# Patient Record
Sex: Female | Born: 1990 | Hispanic: No | Marital: Married | Smoking: Never smoker
Health system: Southern US, Community
[De-identification: ages and names within clinical notes are randomized; demographics above are authoritative.]

## PROBLEM LIST (undated history)

## (undated) DIAGNOSIS — D649 Anemia, unspecified: Secondary | ICD-10-CM

---
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
## 2018-09-13 NOTE — L&D Delivery Note (Signed)
       Delivery Note   Mohogany Toppins is a 28 y.o. G1P1001 at [redacted]w[redacted]d Estimated Date of Delivery: 09/12/19  PRE-OPERATIVE DIAGNOSIS:  1) [redacted]w[redacted]d pregnancy.   POST-OPERATIVE DIAGNOSIS:  1) [redacted]w[redacted]d pregnancy s/p Vaginal, Spontaneous   Delivery Type: Vaginal, Spontaneous    Delivery Anesthesia: Epidural   Labor Complications:  none    ESTIMATED BLOOD LOSS: 415  ml    FINDINGS:   1) female infant, Apgar scores of    at 1 minute and    at 5 minutes and a birthweight of   ounces.    2) Nuchal cord:yes  SPECIMENS:   PLACENTA:   Appearance: Intact , 3 vessel cord   Removal: Spontaneous      Disposition:  held per protocol then discarded  DISPOSITION:  Infant to left in stable condition in the delivery room, with L&D personnel and mother,  NARRATIVE SUMMARY: Labor course:  Ms. Jazminn Pomales is a G1P1001 at [redacted]w[redacted]d who presented for labor management.  She progressed well in labor with pitocin.  She received the appropriate anesthesia and proceeded to complete dilation. She evidenced good maternal expulsive effort during the second stage. She went on to deliver a viable female infant "Adalyn". The placenta delivered without problems and was noted to be complete. A perineal and vaginal examination was performed. Episiotomy/Lacerations: 2nd degree;Vaginal . Episiotomy or lacerations were repaired with 3-0 Vicryl Rapide suture. The patient tolerated this well.  Philip Aspen, CNM  09/12/2019 11:45 PM

## 2018-12-29 ENCOUNTER — Ambulatory Visit: Payer: Self-pay | Admitting: Family Medicine

## 2019-01-17 ENCOUNTER — Telehealth: Payer: Self-pay

## 2019-01-17 NOTE — Telephone Encounter (Signed)
Coronavirus (COVID-19) Are you at risk?  Are you at risk for the Coronavirus (COVID-19)?  To be considered HIGH RISK for Coronavirus (COVID-19), you have to meet the following criteria:  . Traveled to [place], [place], [place], [place] or [place]; or in the [place] to [place], [place], [place], or [place]; and have fever, cough, and shortness of breath within the last 2 weeks of travel OR . Been in close contact with a person diagnosed with COVID-19 within the last 2 weeks and have fever, cough, and shortness of breath . IF YOU DO NOT MEET THESE CRITERIA, YOU ARE CONSIDERED LOW RISK FOR COVID-19.  What to do if you are HIGH RISK for COVID-19?  . If you are having a medical emergency, call 911. . Seek medical care right away. Before you go to a doctor's office, urgent care or emergency department, call ahead and tell them about your recent travel, contact with someone diagnosed with COVID-19, and your symptoms. You should receive instructions from your physician's office regarding next steps of care.  . When you arrive at healthcare provider, tell the healthcare staff immediately you have returned from visiting [place], [place], [place], [place] or [place]; or traveled in the [place] to [place], [place], [place], or [place]; in the last two weeks or you have been in close contact with a person diagnosed with COVID-19 in the last 2 weeks.   . Tell the health care staff about your symptoms: fever, cough and shortness of breath. . After you have been seen by a medical provider, you will be either: o Tested for (COVID-19) and discharged home on quarantine except to seek medical care if symptoms worsen, and asked to  - Stay home and avoid contact with others until you get your results (4-5 days)  - Avoid travel on public transportation if possible (such as bus, train, or airplane) or o Sent to the Emergency Department by EMS for evaluation, COVID-19 testing, and possible  admission depending on your condition and test results.  What to do if you are LOW RISK for COVID-19?  Reduce your risk of any infection by using the same precautions used for avoiding the common cold or flu:  . Wash your hands often with soap and warm water for at least 20 seconds.  If soap and water are not readily available, use an alcohol-based hand sanitizer with at least 60% alcohol.  . If coughing or sneezing, cover your mouth and nose by coughing or sneezing into the elbow areas of your shirt or coat, into a tissue or into your sleeve (not your hands). . Avoid shaking hands with others and consider head nods or verbal greetings only. . Avoid touching your eyes, nose, or mouth with unwashed hands.  . Avoid close contact with people who are sick. . Avoid places or events with large numbers of people in one location, like concerts or sporting events. . Carefully consider travel plans you have or are making. . If you are planning any travel outside or inside the [place], visit the CDC's Travelers' Health webpage for the latest health notices. . If you have some symptoms but not all symptoms, continue to monitor at home and seek medical attention if your symptoms worsen. . If you are having a medical emergency, call 911.   ADDITIONAL HEALTHCARE OPTIONS FOR PATIENTS  [facility] / e-Visit: [email or web address]         [facility]: [phone]  [facility]: [phone]                   [facility]: [phone]   Pre-screen negative, DM.   

## 2019-01-18 ENCOUNTER — Other Ambulatory Visit: Payer: Self-pay

## 2019-01-18 ENCOUNTER — Encounter: Payer: Self-pay | Admitting: Obstetrics and Gynecology

## 2019-01-18 ENCOUNTER — Ambulatory Visit: Payer: BLUE CROSS/BLUE SHIELD | Admitting: Obstetrics and Gynecology

## 2019-01-18 VITALS — BP 136/87 | HR 88 | Ht 63.0 in | Wt 112.5 lb

## 2019-01-18 DIAGNOSIS — N912 Amenorrhea, unspecified: Secondary | ICD-10-CM

## 2019-01-18 LAB — POCT URINE PREGNANCY: Preg Test, Ur: POSITIVE — AB

## 2019-01-18 NOTE — Progress Notes (Signed)
Patient here for confirmation of pregnancy, no complaints.

## 2019-01-18 NOTE — Patient Instructions (Addendum)
Thank you for enrolling in [facility]. Please follow the instructions below to securely access your online medical record. MyChart allows you to send messages to your doctor, view your test results, renew your prescriptions, schedule appointments, and more.  How Do I Sign Up? 1. In your Internet browser, go to [email or web address]. 2. Click on the New  User? link in the Sign In box.  3. Enter your MyChart Access Code exactly as it appears below. You will not need to use this code after you have completed the sign-up process. If you do not sign up before the expiration date, you must request a new code. MyChart Access Code: [email or web address] Expires: 03/04/2019  8:43 AM  4. Enter the last four digits of your Social Security Number (xxxx) and Date of Birth (mm/dd/yyyy) as indicated and click Next. You will be taken to the next sign-up page. 5. Create a MyChart ID. This will be your MyChart login ID and cannot be changed, so think of one that is secure and easy to remember. 6. Create a MyChart password. You can change your password at any time. 7. Enter your Password Reset Question and Answer and click Next. This can be used at a later time if you forget your password.  8. Select your communication preference, and if applicable enter your e-mail address. You will receive e-mail notification when new information is available in MyChart by choosing to receive e-mail notifications and filling in your e-mail. 9. Click Sign In. You can now view your medical record.   Additional Information If you have questions, you can email [email or web address] or call [phone] to talk to our [facility] staff. Remember, MyChart is NOT to be used for urgent needs. For medical emergencies, dial 911.   Common Medications Safe in Pregnancy  Acne:      Constipation:  Benzoyl Peroxide     Colace  Clindamycin      Dulcolax Suppository  Topica Erythromycin     Fibercon  Salicylic Acid       Metamucil         Miralax AVOID:        Senakot   Accutane    Cough:  Retin-A       Cough Drops  Tetracycline      Phenergan w/ Codeine if Rx  Minocycline      Robitussin (Plain & DM)  Antibiotics:     Crabs/Lice:  Ceclor       RID  Cephalosporins    AVOID:  E-Mycins      Kwell  Keflex  Macrobid/Macrodantin   Diarrhea:  Penicillin      Kao-Pectate  Zithromax      Imodium AD         PUSH FLUIDS AVOID:       Cipro     Fever:  Tetracycline      Tylenol (Regular or Extra  Minocycline       Strength)  Levaquin      Extra Strength-Do not          Exceed 8 tabs/24 hrs Caffeine:        <227m/day (equiv. To 1 cup of coffee or  approx. 3 12 oz sodas)         Gas: Cold/Hayfever:       Gas-X  Benadryl      Mylicon  Claritin       Phazyme  **Claritin-D        Chlor-Trimeton    Headaches:  Dimetapp      ASA-Free Excedrin  Drixoral-Non-Drowsy     Cold Compress  Mucinex (Guaifenasin)     Tylenol (Regular or Extra  Sudafed/Sudafed-12 Hour     Strength)  **Sudafed PE Pseudoephedrine   Tylenol Cold & Sinus     Vicks Vapor Rub  Zyrtec  **AVOID if Problems With Blood Pressure         Heartburn: Avoid lying down for at least 1 hour after meals  Aciphex      Maalox     Rash:  Milk of Magnesia     Benadryl    Mylanta       1% Hydrocortisone Cream  Pepcid  Pepcid Complete   Sleep Aids:  Prevacid      Ambien   Prilosec       Benadryl  Rolaids       Chamomile Tea  Tums (Limit 4/day)     Unisom  Zantac       Tylenol PM         Warm milk-add vanilla or  Hemorrhoids:       Sugar for taste  Anusol/Anusol H.C.  (RX: Analapram 2.5%)  Sugar Substitutes:  Hydrocortisone OTC     Ok in moderation  Preparation H      Tucks        Vaseline lotion applied to tissue with wiping    Herpes:     Throat:  Acyclovir      Oragel  Famvir  Valtrex     Vaccines:         Flu Shot Leg Cramps:       *Gardasil  Benadryl      Hepatitis A         Hepatitis B Nasal Spray:       Pneumovax   Saline Nasal Spray     Polio Booster         Tetanus Nausea:       Tuberculosis test or PPD  Vitamin B6 25 mg TID   AVOID:    Dramamine      *Gardasil  Emetrol       Live Poliovirus  Ginger Root 250 mg QID    MMR (measles, mumps &  High Complex Carbs @ Bedtime    rebella)  Sea Bands-Accupressure    Varicella (Chickenpox)  Unisom 1/2 tab TID     *No known complications           If received before Pain:         Known pregnancy;   Darvocet       Resume series after  Lortab        Delivery  Percocet    Yeast:   Tramadol      Femstat  Tylenol 3      Gyne-lotrimin  Ultram       Monistat  Vicodin           MISC:         All Sunscreens           Hair Coloring/highlights          Insect Repellant's          (Including DEET)         Mystic Tans Commonly Asked Questions During Pregnancy  Cats: A parasite can be excreted in cat feces.  To avoid exposure you need to have another person empty the little box.  If you must empty the litter box you will need to  wear gloves.  Wash your hands after handling your cat.  This parasite can also be found in raw or undercooked meat so this should also be avoided.  Colds, Sore Throats, Flu: Please check your medication sheet to see what you can take for symptoms.  If your symptoms are unrelieved by these medications please call the office.  Dental Work: Most any dental work Investment banker, corporate recommends is permitted.  X-rays should only be taken during the first trimester if absolutely necessary.  Your abdomen should be shielded with a lead apron during all x-rays.  Please notify your provider prior to receiving any x-rays.  Novocaine is fine; gas is not recommended.  If your dentist requires a note from [place] prior to dental work please call the office and we will provide one for you.  Exercise: Exercise is an important part of staying healthy during your pregnancy.  You may continue most exercises you were accustomed to prior to pregnancy.  Later in your pregnancy  you will most likely notice you have difficulty with activities requiring balance like riding a bicycle.  It is important that you listen to your body and avoid activities that put you at a higher risk of falling.  Adequate rest and staying well hydrated are a must!  If you have questions about the safety of specific activities ask your provider.    Exposure to Children with illness: Try to avoid obvious exposure; report any symptoms to [place] when noted,  If you have chicken pos, red measles or mumps, you should be immune to these diseases.   Please do not take any vaccines while pregnant unless you have checked with your OB provider.  Fetal Movement: After 28 weeks we recommend you do "kick counts" twice daily.  Lie or sit down in a calm quiet environment and count your baby movements "kicks".  You should feel your baby at least 10 times per hour.  If you have not felt 10 kicks within the first hour get up, walk around and have something sweet to eat or drink then repeat for an additional hour.  If count remains less than 10 per hour notify your provider.  Fumigating: Follow your pest control agent's advice as to how long to stay out of your home.  Ventilate the area well before re-entering.  Hemorrhoids:   Most over-the-counter preparations can be used during pregnancy.  Check your medication to see what is safe to use.  It is important to use a stool softener or fiber in your diet and to drink lots of liquids.  If hemorrhoids seem to be getting worse please call the office.   Hot Tubs:  Hot tubs Jacuzzis and saunas are not recommended while pregnant.  These increase your internal body temperature and should be avoided.  Intercourse:  Sexual intercourse is safe during pregnancy as long as you are comfortable, unless otherwise advised by your provider.  Spotting may occur after intercourse; report any bright red bleeding that is heavier than spotting.  Labor:  If you know that you are in labor, please go  to the hospital.  If you are unsure, please call the office and let us help you decide what to do.  Lifting, straining, etc:  If your job requires heavy lifting or straining please check with your provider for any limitations.  Generally, you should not lift items heavier than that you can lift simply with your hands and arms (no back muscles)  Painting:  Paint fumes do not  harm your pregnancy, but may make you ill and should be avoided if possible.  Latex or water based paints have less odor than oils.  Use adequate ventilation while painting.  Permanents & Hair Color:  Chemicals in hair dyes are not recommended as they cause increase hair dryness which can increase hair loss during pregnancy.  " Highlighting" and permanents are allowed.  Dye may be absorbed differently and permanents may not hold as well during pregnancy.  Sunbathing:  Use a sunscreen, as skin burns easily during pregnancy.  Drink plenty of fluids; avoid over heating.  Tanning Beds:  Because their possible side effects are still unknown, tanning beds are not recommended.  Ultrasound Scans:  Routine ultrasounds are performed at approximately 20 weeks.  You will be able to see your baby's general anatomy an if you would like to know the gender this can usually be determined as well.  If it is questionable when you conceived you may also receive an ultrasound early in your pregnancy for dating purposes.  Otherwise ultrasound exams are not routinely performed unless there is a medical necessity.  Although you can request a scan we ask that you pay for it when conducted because insurance does not cover " patient request" scans.  Work: If your pregnancy proceeds without complications you may work until your due date, unless your physician or employer advises otherwise.  Round Ligament Pain/Pelvic Discomfort:  Sharp, shooting pains not associated with bleeding are fairly common, usually occurring in the second trimester of pregnancy.   They tend to be worse when standing up or when you remain standing for long periods of time.  These are the result of pressure of certain pelvic ligaments called "round ligaments".  Rest, Tylenol and heat seem to be the most effective relief.  As the womb and fetus grow, they rise out of the pelvis and the discomfort improves.  Please notify the office if your pain seems different than that described.  It may represent a more serious condition.  First Trimester of Pregnancy The first trimester of pregnancy is from week 1 until the end of week 13 (months 1 through 3). A week after a sperm fertilizes an egg, the egg will implant on the wall of the uterus. This embryo will begin to develop into a baby. Genes from you and your partner will form the baby. The female genes will determine whether the baby will be a boy or a girl. At 6-8 weeks, the eyes and face will be formed, and the heartbeat can be seen on ultrasound. At the end of 12 weeks, all the baby's organs will be formed. Now that you are pregnant, you will want to do everything you can to have a healthy baby. Two of the most important things are to get good prenatal care and to follow your health care provider's instructions. Prenatal care is all the medical care you receive before the baby's birth. This care will help prevent, find, and treat any problems during the pregnancy and childbirth. Body changes during your first trimester Your body goes through many changes during pregnancy. The changes vary from woman to woman.  You may gain or lose a couple of pounds at first.  You may feel sick to your stomach (nauseous) and you may throw up (vomit). If the vomiting is uncontrollable, call your health care provider.  You may tire easily.  You may develop headaches that can be relieved by medicines. All medicines should be approved by your health  care provider.  You may urinate more often. Painful urination may mean you have a bladder infection.  You  may develop heartburn as a result of your pregnancy.  You may develop constipation because certain hormones are causing the muscles that push stool through your intestines to slow down.  You may develop hemorrhoids or swollen veins (varicose veins).  Your breasts may begin to grow larger and become tender. Your nipples may stick out more, and the tissue that surrounds them (areola) may become darker.  Your gums may bleed and may be sensitive to brushing and flossing.  Dark spots or blotches (chloasma, mask of pregnancy) may develop on your face. This will likely fade after the baby is born.  Your menstrual periods will stop.  You may have a loss of appetite.  You may develop cravings for certain kinds of food.  You may have changes in your emotions from day to day, such as being excited to be pregnant or being concerned that something may go wrong with the pregnancy and baby.  You may have more vivid and strange dreams.  You may have changes in your hair. These can include thickening of your hair, rapid growth, and changes in texture. Some women also have hair loss during or after pregnancy, or hair that feels dry or thin. Your hair will most likely return to normal after your baby is born. What to expect at prenatal visits During a routine prenatal visit:  You will be weighed to make sure you and the baby are growing normally.  Your blood pressure will be taken.  Your abdomen will be measured to track your baby's growth.  The fetal heartbeat will be listened to between weeks 10 and 14 of your pregnancy.  Test results from any previous visits will be discussed. Your health care provider may ask you:  How you are feeling.  If you are feeling the baby move.  If you have had any abnormal symptoms, such as leaking fluid, bleeding, severe headaches, or abdominal cramping.  If you are using any tobacco products, including cigarettes, chewing tobacco, and electronic cigarettes.   If you have any questions. Other tests that may be performed during your first trimester include:  Blood tests to find your blood type and to check for the presence of any previous infections. The tests will also be used to check for low iron levels (anemia) and protein on red blood cells (Rh antibodies). Depending on your risk factors, or if you previously had diabetes during pregnancy, you may have tests to check for high blood sugar that affects pregnant women (gestational diabetes).  Urine tests to check for infections, diabetes, or protein in the urine.  An ultrasound to confirm the proper growth and development of the baby.  Fetal screens for spinal cord problems (spina bifida) and Down syndrome.  HIV (human immunodeficiency virus) testing. Routine prenatal testing includes screening for HIV, unless you choose not to have this test.  You may need other tests to make sure you and the baby are doing well. Follow these instructions at home: Medicines  Follow your health care provider's instructions regarding medicine use. Specific medicines may be either safe or unsafe to take during pregnancy.  Take a prenatal vitamin that contains at least 600 micrograms (mcg) of folic acid.  If you develop constipation, try taking a stool softener if your health care provider approves. Eating and drinking   Eat a balanced diet that includes fresh fruits and vegetables, whole grains, good  sources of protein such as meat, eggs, or tofu, and low-fat dairy. Your health care provider will help you determine the amount of weight gain that is right for you.  Avoid raw meat and uncooked cheese. These carry germs that can cause birth defects in the baby.  Eating four or five small meals rather than three large meals a day may help relieve nausea and vomiting. If you start to feel nauseous, eating a few soda crackers can be helpful. Drinking liquids between meals, instead of during meals, also seems to help  ease nausea and vomiting.  Limit foods that are high in fat and processed sugars, such as fried and sweet foods.  To prevent constipation: ? Eat foods that are high in fiber, such as fresh fruits and vegetables, whole grains, and beans. ? Drink enough fluid to keep your urine clear or pale yellow. Activity  Exercise only as directed by your health care provider. Most women can continue their usual exercise routine during pregnancy. Try to exercise for 30 minutes at least 5 days a week. Exercising will help you: ? Control your weight. ? Stay in shape. ? Be prepared for labor and delivery.  Experiencing pain or cramping in the lower abdomen or lower back is a good sign that you should stop exercising. Check with your health care provider before continuing with normal exercises.  Try to avoid standing for long periods of time. Move your legs often if you must stand in one place for a long time.  Avoid heavy lifting.  Wear low-heeled shoes and practice good posture.  You may continue to have sex unless your health care provider tells you not to. Relieving pain and discomfort  Wear a good support bra to relieve breast tenderness.  Take warm sitz baths to soothe any pain or discomfort caused by hemorrhoids. Use hemorrhoid cream if your health care provider approves.  Rest with your legs elevated if you have leg cramps or low back pain.  If you develop varicose veins in your legs, wear support hose. Elevate your feet for 15 minutes, 3-4 times a day. Limit salt in your diet. Prenatal care  Schedule your prenatal visits by the twelfth week of pregnancy. They are usually scheduled monthly at first, then more often in the last 2 months before delivery.  Write down your questions. Take them to your prenatal visits.  Keep all your prenatal visits as told by your health care provider. This is important. Safety  Wear your seat belt at all times when driving.  Make a list of emergency  phone numbers, including numbers for family, friends, the hospital, and police and fire departments. General instructions  Ask your health care provider for a referral to a local prenatal education class. Begin classes no later than the beginning of month 6 of your pregnancy.  Ask for help if you have counseling or nutritional needs during pregnancy. Your health care provider can offer advice or refer you to specialists for help with various needs.  Do not use hot tubs, steam rooms, or saunas.  Do not douche or use tampons or scented sanitary pads.  Do not cross your legs for long periods of time.  Avoid cat litter boxes and soil used by cats. These carry germs that can cause birth defects in the baby and possibly loss of the fetus by miscarriage or stillbirth.  Avoid all smoking, herbs, alcohol, and medicines not prescribed by your health care provider. Chemicals in these products affect the formation  and growth of the baby.  Do not use any products that contain nicotine or tobacco, such as cigarettes and e-cigarettes. If you need help quitting, ask your health care provider. You may receive counseling support and other resources to help you quit.  Schedule a dentist appointment. At home, brush your teeth with a soft toothbrush and be gentle when you floss. Contact a health care provider if:  You have dizziness.  You have mild pelvic cramps, pelvic pressure, or nagging pain in the abdominal area.  You have persistent nausea, vomiting, or diarrhea.  You have a bad smelling vaginal discharge.  You have pain when you urinate.  You notice increased swelling in your face, hands, legs, or ankles.  You are exposed to fifth disease or chickenpox.  You are exposed to [place] measles (rubella) and have never had it. Get help right away if:  You have a fever.  You are leaking fluid from your vagina.  You have spotting or bleeding from your vagina.  You have severe abdominal cramping  or pain.  You have rapid weight gain or loss.  You vomit blood or material that looks like coffee grounds.  You develop a severe headache.  You have shortness of breath.  You have any kind of trauma, such as from a fall or a car accident. Summary  The first trimester of pregnancy is from week 1 until the end of week 13 (months 1 through 3).  Your body goes through many changes during pregnancy. The changes vary from woman to woman.  You will have routine prenatal visits. During those visits, your health care provider will examine you, discuss any test results you may have, and talk with you about how you are feeling. This information is not intended to replace advice given to you by your health care provider. Make sure you discuss any questions you have with your health care provider. Document Released: 08/24/2001 Document Revised: 08/11/2016 Document Reviewed: 08/11/2016 Elsevier Interactive Patient Education  2019 [employer].

## 2019-01-18 NOTE — Progress Notes (Signed)
  Subjective:     Patient ID: Phyllis Jones, female   DOB: 1990/12/27, 28 y.o.   MRN: [id number]  HPI Here for pregnancy confirmation. States stopped OCPs 1 year ago and is happy about being pregnant. Reports LMP 12/04/2018, with 35-50 day cycles for the last year. EDC by that LMP 09/09/2019 & EGA [redacted]w[redacted]d.  Review of Systems     Objective:   Physical Exam A&Ox4 Well groomed female in no distress Blood pressure 136/87, pulse 88, height 5\' 3"  (1.6 m), weight 112 lb 8 oz (51 kg), last menstrual period 12/04/2018. Body mass index is 19.93 kg/m.  UPT+    Assessment:     Missed menses    Plan:     Congratulated on pregnancy. Information to review given about pregnancy. Will RTC in 2 weeks for viability and dating scan/nurse intake & labs, then 5 weeks for New OB PE with me.      Melody Shambley,CNM

## 2019-01-29 ENCOUNTER — Ambulatory Visit: Payer: Self-pay | Admitting: Family Medicine

## 2019-01-31 ENCOUNTER — Telehealth: Payer: Self-pay

## 2019-01-31 NOTE — Telephone Encounter (Signed)
Coronavirus (COVID-19) Are you at risk?  Are you at risk for the Coronavirus (COVID-19)?  To be considered HIGH RISK for Coronavirus (COVID-19), you have to meet the following criteria:  . Traveled to [place], [place], [place], [place] or [place]; or in the [place] to [place], [place], [place], or [place]; and have fever, cough, and shortness of breath within the last 2 weeks of travel OR . Been in close contact with a person diagnosed with COVID-19 within the last 2 weeks and have fever, cough, and shortness of breath . IF YOU DO NOT MEET THESE CRITERIA, YOU ARE CONSIDERED LOW RISK FOR COVID-19.  What to do if you are HIGH RISK for COVID-19?  . If you are having a medical emergency, call 911. . Seek medical care right away. Before you go to a doctor's office, urgent care or emergency department, call ahead and tell them about your recent travel, contact with someone diagnosed with COVID-19, and your symptoms. You should receive instructions from your physician's office regarding next steps of care.  . When you arrive at healthcare provider, tell the healthcare staff immediately you have returned from visiting [place], [place], [place], [place] or [place]; or traveled in the [place] to [place], [place], [place], or [place]; in the last two weeks or you have been in close contact with a person diagnosed with COVID-19 in the last 2 weeks.   . Tell the health care staff about your symptoms: fever, cough and shortness of breath. . After you have been seen by a medical provider, you will be either: o Tested for (COVID-19) and discharged home on quarantine except to seek medical care if symptoms worsen, and asked to  - Stay home and avoid contact with others until you get your results (4-5 days)  - Avoid travel on public transportation if possible (such as bus, train, or airplane) or o Sent to the Emergency Department by EMS for evaluation, COVID-19 testing, and possible  admission depending on your condition and test results.  What to do if you are LOW RISK for COVID-19?  Reduce your risk of any infection by using the same precautions used for avoiding the common cold or flu:  . Wash your hands often with soap and warm water for at least 20 seconds.  If soap and water are not readily available, use an alcohol-based hand sanitizer with at least 60% alcohol.  . If coughing or sneezing, cover your mouth and nose by coughing or sneezing into the elbow areas of your shirt or coat, into a tissue or into your sleeve (not your hands). . Avoid shaking hands with others and consider head nods or verbal greetings only. . Avoid touching your eyes, nose, or mouth with unwashed hands.  . Avoid close contact with people who are sick. . Avoid places or events with large numbers of people in one location, like concerts or sporting events. . Carefully consider travel plans you have or are making. . If you are planning any travel outside or inside the [place], visit the CDC's Travelers' Health webpage for the latest health notices. . If you have some symptoms but not all symptoms, continue to monitor at home and seek medical attention if your symptoms worsen. . If you are having a medical emergency, call 911.   ADDITIONAL HEALTHCARE OPTIONS FOR PATIENTS  [facility] / e-Visit: [email or web address]         [facility]: [phone]  [facility]: [phone]                   [facility]: [phone]   Pre-screen negative, DM.   

## 2019-02-01 ENCOUNTER — Other Ambulatory Visit: Payer: BLUE CROSS/BLUE SHIELD

## 2019-02-01 ENCOUNTER — Other Ambulatory Visit: Payer: Self-pay

## 2019-02-01 ENCOUNTER — Ambulatory Visit: Payer: BLUE CROSS/BLUE SHIELD

## 2019-02-01 ENCOUNTER — Ambulatory Visit (INDEPENDENT_AMBULATORY_CARE_PROVIDER_SITE_OTHER): Payer: BLUE CROSS/BLUE SHIELD

## 2019-02-01 VITALS — BP 136/97 | HR 74 | Ht 63.0 in | Wt 113.1 lb

## 2019-02-01 DIAGNOSIS — Z3A08 8 weeks gestation of pregnancy: Secondary | ICD-10-CM

## 2019-02-01 DIAGNOSIS — Z3687 Encounter for antenatal screening for uncertain dates: Secondary | ICD-10-CM | POA: Diagnosis not present

## 2019-02-01 DIAGNOSIS — N912 Amenorrhea, unspecified: Secondary | ICD-10-CM

## 2019-02-01 NOTE — Progress Notes (Unsigned)
Phyllis Jones presents for NOB nurse interview visit. Pregnancy confirmation done here at [facility].  G- 1.  P-    . Pregnancy education material explained and given. No cats in the home. NOB labs ordered. , (sickle cell). HIV labs and Drug screen were explained optional and she did not decline. Drug screen ordered PNV encouraged. Genetic screening options discussed. Genetic testing: unsure.  Financial policy discussed, FMLA paper signed, All questions answered, pt will return in 2.5 weeks to see provider.

## 2019-02-03 LAB — HIV ANTIBODY (ROUTINE TESTING W REFLEX): HIV Screen 4th Generation wRfx: NONREACTIVE

## 2019-02-03 LAB — HEPATITIS B SURFACE ANTIGEN: Hepatitis B Surface Ag: NEGATIVE

## 2019-02-03 LAB — HGB SOLU + RFLX FRAC: Sickle Solubility Test - HGBRFX: NEGATIVE

## 2019-02-03 LAB — RPR: RPR Ser Ql: NONREACTIVE

## 2019-02-03 LAB — VARICELLA ZOSTER ANTIBODY, IGG: Varicella zoster IgG: 200 index (ref >165)

## 2019-02-03 LAB — ANTIBODY SCREEN: Antibody Screen: NEGATIVE

## 2019-02-03 LAB — ABO AND RH: Rh Factor: POSITIVE

## 2019-02-22 ENCOUNTER — Other Ambulatory Visit: Payer: Self-pay

## 2019-02-22 ENCOUNTER — Encounter: Payer: Self-pay | Admitting: Obstetrics and Gynecology

## 2019-02-22 ENCOUNTER — Ambulatory Visit (INDEPENDENT_AMBULATORY_CARE_PROVIDER_SITE_OTHER): Payer: BLUE CROSS/BLUE SHIELD | Admitting: Obstetrics and Gynecology

## 2019-02-22 VITALS — BP 122/70 | HR 88 | Wt 115.9 lb

## 2019-02-22 DIAGNOSIS — Z3401 Encounter for supervision of normal first pregnancy, first trimester: Secondary | ICD-10-CM

## 2019-02-22 DIAGNOSIS — Z3493 Encounter for supervision of normal pregnancy, unspecified, third trimester: Secondary | ICD-10-CM

## 2019-02-22 DIAGNOSIS — Z3A11 11 weeks gestation of pregnancy: Secondary | ICD-10-CM

## 2019-02-22 LAB — POCT URINALYSIS DIPSTICK OB
Bilirubin, UA: NEGATIVE
Blood, UA: NEGATIVE
Glucose, UA: NEGATIVE
Ketones, UA: NEGATIVE
Leukocytes, UA: NEGATIVE
Nitrite, UA: NEGATIVE
POC,PROTEIN,UA: NEGATIVE
Spec Grav, UA: 1.015 (ref 1.010–1.025)
Urobilinogen, UA: 0.2 E.U./dL
pH, UA: 7 (ref 5.0–8.0)

## 2019-02-22 NOTE — Progress Notes (Signed)
NEW OB HISTORY AND PHYSICAL  SUBJECTIVE:       Phyllis Jones is a 28 y.o. G1P0 female, Patient's last menstrual period was 12/06/2018., Estimated Date of Delivery: 09/12/19, 4123w1d, presents today for establishment of Prenatal Care. She has no unusual complaints and complains of some nausea.      Gynecologic History Patient's last menstrual period was 12/06/2018. Normal Contraception: none Last Pap: 2019. Results were: normal  Obstetric History OB History  Gravida Para Term Preterm AB Living  1            SAB TAB Ectopic Multiple Live Births               # Outcome Date GA Lbr Len/2nd Weight Sex Delivery Anes PTL Lv  1 Current             History reviewed. No pertinent past medical history.  Past Surgical History:  Procedure Laterality Date  . TONSILLECTOMY  2004    Current Outpatient Medications on File Prior to Visit  Medication Sig Dispense Refill  . Prenatal Vit-Fe Fumarate-FA (MULTIVITAMIN-PRENATAL) 27-0.8 MG TABS tablet Take 1 tablet by mouth daily at 12 noon.     No current facility-administered medications on file prior to visit.     No Known Allergies  Social History   Socioeconomic History  . Marital status: Married    Spouse name: Not on file  . Number of children: Not on file  . Years of education: Not on file  . Highest education level: Not on file  Occupational History  . Not on file  Social Needs  . Financial resource strain: Not on file  . Food insecurity    Worry: Not on file    Inability: Not on file  . Transportation needs    Medical: Not on file    Non-medical: Not on file  Tobacco Use  . Smoking status: Never Smoker  . Smokeless tobacco: Never Used  Substance and Sexual Activity  . Alcohol use: Not Currently  . Drug use: Never  . Sexual activity: Yes    Birth control/protection: None  Lifestyle  . Physical activity    Days per week: Not on file    Minutes per session: Not on file  . Stress: Not on file  Relationships  .  Social Musicianconnections    Talks on phone: Not on file    Gets together: Not on file    Attends religious service: Not on file    Active member of club or organization: Not on file    Attends meetings of clubs or organizations: Not on file    Relationship status: Not on file  . Intimate partner violence    Fear of current or ex partner: Not on file    Emotionally abused: Not on file    Physically abused: Not on file    Forced sexual activity: Not on file  Other Topics Concern  . Not on file  Social History Narrative  . Not on file    Family History  Problem Relation Age of Onset  . Breast cancer Neg Hx   . Ovarian cancer Neg Hx   . Colon cancer Neg Hx     The following portions of the patient's history were reviewed and updated as appropriate: allergies, current medications, past OB history, past medical history, past surgical history, past family history, past social history, and problem list.    OBJECTIVE: Initial Physical Exam (New OB)  GENERAL APPEARANCE: alert, well appearing, in  no apparent distress, oriented to person, place and time HEAD: normocephalic, atraumatic MOUTH: mucous membranes moist, pharynx normal without lesions and dental hygiene good THYROID: no thyromegaly or masses present BREASTS: not examined LUNGS: clear to auscultation, no wheezes, rales or rhonchi, symmetric air entry HEART: regular rate and rhythm, no murmurs ABDOMEN: soft, nontender, nondistended, no abnormal masses, no epigastric pain, fundus not palpable and FHT present EXTREMITIES: no redness or tenderness in the calves or thighs SKIN: normal coloration and turgor, no rashes LYMPH NODES: no adenopathy palpable NEUROLOGIC: alert, oriented, normal speech, no focal findings or movement disorder noted  PELVIC EXAM deferred  ASSESSMENT: Normal pregnancy Elevated BP  PLAN: Prenatal care Declines genetic screening Will check BP at home and work- states normal readings outside of doctors  office, 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  See orders

## 2019-02-22 NOTE — Patient Instructions (Signed)

## 2019-02-22 NOTE — Progress Notes (Signed)
NOB PE- pt is doing well, declines genetic testing

## 2019-02-23 LAB — BASIC METABOLIC PANEL
BUN/Creatinine Ratio: 13 (ref 9–23)
BUN: 8 mg/dL (ref 6–20)
CO2: 21 mmol/L (ref 20–29)
Calcium: 9 mg/dL (ref 8.7–10.2)
Chloride: 99 mmol/L (ref 96–106)
Creatinine, Ser: 0.6 mg/dL (ref 0.57–1.00)
GFR calc Af Amer: 147 mL/min/{1.73_m2} (ref >59)
GFR calc non Af Amer: 127 mL/min/{1.73_m2} (ref >59)
Glucose: 78 mg/dL (ref 65–99)
Potassium: 3.9 mmol/L (ref 3.5–5.2)
Sodium: 134 mmol/L (ref 134–144)

## 2019-02-23 LAB — CBC
Hematocrit: 36.6 % (ref 34.0–46.6)
Hemoglobin: 12.7 g/dL (ref 11.1–15.9)
MCH: 30 pg (ref 26.6–33.0)
MCHC: 34.7 g/dL (ref 31.5–35.7)
MCV: 86 fL (ref 79–97)
Platelets: 313 10*3/uL (ref 150–450)
RBC: 4.24 x10E6/uL (ref 3.77–5.28)
RDW: 13.3 % (ref 11.7–15.4)
WBC: 7.5 10*3/uL (ref 3.4–10.8)

## 2019-02-23 LAB — RUBELLA SCREEN: Rubella Antibodies, IGG: 9.95 index (ref >0.99)

## 2019-03-22 ENCOUNTER — Telehealth: Payer: Self-pay

## 2019-03-22 NOTE — Telephone Encounter (Signed)
Pt called no answer LMTRC for prescreening. 

## 2019-03-23 ENCOUNTER — Other Ambulatory Visit: Payer: Self-pay

## 2019-03-23 ENCOUNTER — Other Ambulatory Visit: Payer: Self-pay | Admitting: Certified Nurse Midwife

## 2019-03-23 ENCOUNTER — Ambulatory Visit (INDEPENDENT_AMBULATORY_CARE_PROVIDER_SITE_OTHER): Payer: BC Managed Care – PPO | Admitting: Certified Nurse Midwife

## 2019-03-23 VITALS — BP 114/76 | HR 91 | Wt 122.1 lb

## 2019-03-23 DIAGNOSIS — Z3689 Encounter for other specified antenatal screening: Secondary | ICD-10-CM

## 2019-03-23 DIAGNOSIS — Z3402 Encounter for supervision of normal first pregnancy, second trimester: Secondary | ICD-10-CM

## 2019-03-23 LAB — POCT URINALYSIS DIPSTICK OB
Bilirubin, UA: NEGATIVE
Blood, UA: NEGATIVE
Glucose, UA: NEGATIVE
Ketones, UA: NEGATIVE
Leukocytes, UA: NEGATIVE
Nitrite, UA: NEGATIVE
POC,PROTEIN,UA: NEGATIVE
Spec Grav, UA: 1.015 (ref 1.010–1.025)
Urobilinogen, UA: 0.2 E.U./dL
pH, UA: 7.5 (ref 5.0–8.0)

## 2019-03-23 NOTE — Progress Notes (Signed)
ROB-Feeling good overall. Questions answered regarding sleeping positions and food precautions during pregnancy. Discussed upcoming course of prenatal care. Reviewed red flag symptoms and when to call. RTC x 4-5 weeks for ANATOMY SCAN and ROB or sooner if needed.

## 2019-03-23 NOTE — Patient Instructions (Signed)
Common Medications Safe in Pregnancy  Acne:      Constipation:  Benzoyl Peroxide     Colace  Clindamycin      Dulcolax Suppository  Topica Erythromycin     Fibercon  Salicylic Acid      Metamucil         Miralax AVOID:        Senakot   Accutane    Cough:  Retin-A       Cough Drops  Tetracycline      Phenergan w/ Codeine if Rx  Minocycline      Robitussin (Plain & DM)  Antibiotics:     Crabs/Lice:  Ceclor       RID  Cephalosporins    AVOID:  E-Mycins      Kwell  Keflex  Macrobid/Macrodantin   Diarrhea:  Penicillin      Kao-Pectate  Zithromax      Imodium AD         PUSH FLUIDS AVOID:       Cipro     Fever:  Tetracycline      Tylenol (Regular or Extra  Minocycline       Strength)  Levaquin      Extra Strength-Do not          Exceed 8 tabs/24 hrs Caffeine:        <264m/day (equiv. To 1 cup of coffee or  approx. 3 12 oz sodas)         Gas: Cold/Hayfever:       Gas-X  Benadryl      Mylicon  Claritin       Phazyme  **Claritin-D        Chlor-Trimeton    Headaches:  Dimetapp      ASA-Free Excedrin  Drixoral-Non-Drowsy     Cold Compress  Mucinex (Guaifenasin)     Tylenol (Regular or Extra  Sudafed/Sudafed-12 Hour     Strength)  **Sudafed PE Pseudoephedrine   Tylenol Cold & Sinus     Vicks Vapor Rub  Zyrtec  **AVOID if Problems With Blood Pressure         Heartburn: Avoid lying down for at least 1 hour after meals  Aciphex      Maalox     Rash:  Milk of Magnesia     Benadryl    Mylanta       1% Hydrocortisone Cream  Pepcid  Pepcid Complete   Sleep Aids:  Prevacid      Ambien   Prilosec       Benadryl  Rolaids       Chamomile Tea  Tums (Limit 4/day)     Unisom  Zantac       Tylenol PM         Warm milk-add vanilla or  Hemorrhoids:       Sugar for taste  Anusol/Anusol H.C.  (RX: Analapram 2.5%)  Sugar Substitutes:  Hydrocortisone OTC     Ok in moderation  Preparation H      Tucks        Vaseline lotion applied to tissue with wiping    Herpes:      Throat:  Acyclovir      Oragel  Famvir  Valtrex     Vaccines:         Flu Shot Leg Cramps:       *Gardasil  Benadryl      Hepatitis A         Hepatitis B Nasal Spray:  Pneumovax  Saline Nasal Spray     Polio Booster         Tetanus Nausea:       Tuberculosis test or PPD  Vitamin B6 25 mg TID   AVOID:    Dramamine      *Gardasil  Emetrol       Live Poliovirus  Ginger Root 250 mg QID    MMR (measles, mumps &  High Complex Carbs @ Bedtime    rebella)  Sea Bands-Accupressure    Varicella (Chickenpox)  Unisom 1/2 tab TID     *No known complications           If received before Pain:         Known pregnancy;   Darvocet       Resume series after  Lortab        Delivery  Percocet    Yeast:   Tramadol      Femstat  Tylenol 3      Gyne-lotrimin  Ultram       Monistat  Vicodin           MISC:         All Sunscreens           Hair Coloring/highlights          Insect Repellant's          (Including DEET)         Mystic Tans Second Trimester of Pregnancy  The second trimester is from week 14 through week 27 (month 4 through 6). This is often the time in pregnancy that you feel your best. Often times, morning sickness has lessened or quit. You may have more energy, and you may get hungry more often. Your unborn baby is growing rapidly. At the end of the sixth month, he or she is about 9 inches long and weighs about 1 pounds. You will likely feel the baby move between 18 and 20 weeks of pregnancy. Follow these instructions at home: Medicines  Take over-the-counter and prescription medicines only as told by your doctor. Some medicines are safe and some medicines are not safe during pregnancy.  Take a prenatal vitamin that contains at least 600 micrograms (mcg) of folic acid.  If you have trouble pooping (constipation), take medicine that will make your stool soft (stool softener) if your doctor approves. Eating and drinking   Eat regular, healthy meals.  Avoid raw meat and  uncooked cheese.  If you get low calcium from the food you eat, talk to your doctor about taking a daily calcium supplement.  Avoid foods that are high in fat and sugars, such as fried and sweet foods.  If you feel sick to your stomach (nauseous) or throw up (vomit): ? Eat 4 or 5 small meals a day instead of 3 large meals. ? Try eating a few soda crackers. ? Drink liquids between meals instead of during meals.  To prevent constipation: ? Eat foods that are high in fiber, like fresh fruits and vegetables, whole grains, and beans. ? Drink enough fluids to keep your pee (urine) clear or pale yellow. Activity  Exercise only as told by your doctor. Stop exercising if you start to have cramps.  Do not exercise if it is too hot, too humid, or if you are in a place of great height (high altitude).  Avoid heavy lifting.  Wear low-heeled shoes. Sit and stand up straight.  You can continue to have sex unless your doctor   tells you not to. Relieving pain and discomfort  Wear a good support bra if your breasts are tender.  Take warm water baths (sitz baths) to soothe pain or discomfort caused by hemorrhoids. Use hemorrhoid cream if your doctor approves.  Rest with your legs raised if you have leg cramps or low back pain.  If you develop puffy, bulging veins (varicose veins) in your legs: ? Wear support hose or compression stockings as told by your doctor. ? Raise (elevate) your feet for 15 minutes, 3-4 times a day. ? Limit salt in your food. Prenatal care  Write down your questions. Take them to your prenatal visits.  Keep all your prenatal visits as told by your doctor. This is important. Safety  Wear your seat belt when driving.  Make a list of emergency phone numbers, including numbers for family, friends, the hospital, and police and fire departments. General instructions  Ask your doctor about the right foods to eat or for help finding a counselor, if you need these services.   Ask your doctor about local prenatal classes. Begin classes before month 6 of your pregnancy.  Do not use hot tubs, steam rooms, or saunas.  Do not douche or use tampons or scented sanitary pads.  Do not cross your legs for long periods of time.  Visit your dentist if you have not done so. Use a soft toothbrush to brush your teeth. Floss gently.  Avoid all smoking, herbs, and alcohol. Avoid drugs that are not approved by your doctor.  Do not use any products that contain nicotine or tobacco, such as cigarettes and e-cigarettes. If you need help quitting, ask your doctor.  Avoid cat litter boxes and soil used by cats. These carry germs that can cause birth defects in the baby and can cause a loss of your baby (miscarriage) or stillbirth. Contact a doctor if:  You have mild cramps or pressure in your lower belly.  You have pain when you pee (urinate).  You have bad smelling fluid coming from your vagina.  You continue to feel sick to your stomach (nauseous), throw up (vomit), or have watery poop (diarrhea).  You have a nagging pain in your belly area.  You feel dizzy. Get help right away if:  You have a fever.  You are leaking fluid from your vagina.  You have spotting or bleeding from your vagina.  You have severe belly cramping or pain.  You lose or gain weight rapidly.  You have trouble catching your breath and have chest pain.  You notice sudden or extreme puffiness (swelling) of your face, hands, ankles, feet, or legs.  You have not felt the baby move in over an hour.  You have severe headaches that do not go away when you take medicine.  You have trouble seeing. Summary  The second trimester is from week 14 through week 27 (months 4 through 6). This is often the time in pregnancy that you feel your best.  To take care of yourself and your unborn baby, you will need to eat healthy meals, take medicines only if your doctor tells you to do so, and do  activities that are safe for you and your baby.  Call your doctor if you get sick or if you notice anything unusual about your pregnancy. Also, call your doctor if you need help with the right food to eat, or if you want to know what activities are safe for you. This information is not intended to replace  advice given to you by your health care provider. Make sure you discuss any questions you have with your health care provider. Document Released: 11/24/2009 Document Revised: 12/22/2018 Document Reviewed: 10/05/2016 Elsevier Patient Education  2020 [employer]vier Inc.  

## 2019-03-28 ENCOUNTER — Telehealth: Payer: Self-pay | Admitting: Obstetrics and Gynecology

## 2019-03-28 NOTE — Telephone Encounter (Signed)
The patient called and stated that she needs a call back from her nurse this morning. No other information was disclosed. Please advise.

## 2019-03-28 NOTE — Telephone Encounter (Signed)
Sent pt mcm-ac 

## 2019-04-18 ENCOUNTER — Encounter: Payer: BC Managed Care – PPO | Admitting: Certified Nurse Midwife

## 2019-04-18 ENCOUNTER — Other Ambulatory Visit: Payer: BC Managed Care – PPO

## 2019-04-25 ENCOUNTER — Encounter: Payer: Self-pay | Admitting: Certified Nurse Midwife

## 2019-04-25 ENCOUNTER — Ambulatory Visit (INDEPENDENT_AMBULATORY_CARE_PROVIDER_SITE_OTHER): Payer: BC Managed Care – PPO | Admitting: Certified Nurse Midwife

## 2019-04-25 ENCOUNTER — Other Ambulatory Visit: Payer: Self-pay

## 2019-04-25 ENCOUNTER — Ambulatory Visit (INDEPENDENT_AMBULATORY_CARE_PROVIDER_SITE_OTHER): Payer: BC Managed Care – PPO

## 2019-04-25 VITALS — BP 111/86 | HR 88 | Wt 124.6 lb

## 2019-04-25 DIAGNOSIS — Z3402 Encounter for supervision of normal first pregnancy, second trimester: Secondary | ICD-10-CM | POA: Diagnosis not present

## 2019-04-25 DIAGNOSIS — Z3689 Encounter for other specified antenatal screening: Secondary | ICD-10-CM | POA: Diagnosis not present

## 2019-04-25 DIAGNOSIS — Z3A20 20 weeks gestation of pregnancy: Secondary | ICD-10-CM

## 2019-04-25 LAB — POCT URINALYSIS DIPSTICK OB
Bilirubin, UA: NEGATIVE
Blood, UA: NEGATIVE
Glucose, UA: NEGATIVE
Ketones, UA: NEGATIVE
Leukocytes, UA: NEGATIVE
Nitrite, UA: NEGATIVE
POC,PROTEIN,UA: NEGATIVE
Spec Grav, UA: 1.02 (ref 1.010–1.025)
Urobilinogen, UA: 0.2 E.U./dL
pH, UA: 6 (ref 5.0–8.0)

## 2019-04-25 NOTE — Progress Notes (Signed)
ROB doing well. Feels little movement. Anatomy u/s today ( see below). Discussed anterior placenta. She verbalizes understanding. Follow up 4 wks u/s and ROB with Melody .   Philip Aspen, CNM   Patient Name: Phyllis Jones DOB: 09/23/1990 MRN: [id number] ULTRASOUND REPORT  Location: [facility] Date of Service: 04/25/2019   Indications:Anatomy Ultrasound Findings:  [name] intrauterine pregnancy is visualized with FHR at 163 BPM. Biometrics give an (U/S) Gestational age of 107w4d and an (U/S) EDD of 09/15/2019; this correlates with the clinically established Estimated Date of Delivery: 09/12/19  Fetal presentation is Variable.  EFW: 326 g ( 12 oz). Placenta: anterior. Grade: 1 AFI: subjectively normal.  Anatomic survey is incomplete for Spine and normal; Gender - female.    Right Ovary is normal in appearance. Left Ovary is normal appearance. Survey of the adnexa demonstrates no adnexal masses. There is no free peritoneal fluid in the cul de sac.  Impression: 1. [redacted]w[redacted]d Viable Singleton Intrauterine pregnancy by U/S. 2. (U/S) EDD is consistent with Clinically established Estimated Date of Delivery: 09/12/19 . 3. Normal Anatomy Scan 4  Anatomic survey is incomplete for Spine   Recommendations: 1.Clinical correlation with the patient's History and Physical Exam.   Jenine M. Albertine Grates    RDMS

## 2019-04-25 NOTE — Patient Instructions (Signed)

## 2019-05-24 ENCOUNTER — Encounter: Payer: BC Managed Care – PPO | Admitting: Obstetrics and Gynecology

## 2019-05-24 ENCOUNTER — Other Ambulatory Visit: Payer: BC Managed Care – PPO

## 2019-05-25 ENCOUNTER — Ambulatory Visit (INDEPENDENT_AMBULATORY_CARE_PROVIDER_SITE_OTHER): Payer: BLUE CROSS/BLUE SHIELD | Admitting: Certified Nurse Midwife

## 2019-05-25 ENCOUNTER — Encounter: Payer: Self-pay | Admitting: Certified Nurse Midwife

## 2019-05-25 ENCOUNTER — Other Ambulatory Visit: Payer: Self-pay

## 2019-05-25 VITALS — BP 113/71 | HR 84 | Wt 128.3 lb

## 2019-05-25 DIAGNOSIS — Z3402 Encounter for supervision of normal first pregnancy, second trimester: Secondary | ICD-10-CM

## 2019-05-25 LAB — POCT URINALYSIS DIPSTICK OB
Bilirubin, UA: NEGATIVE
Blood, UA: NEGATIVE
Glucose, UA: NEGATIVE
Ketones, UA: NEGATIVE
Leukocytes, UA: NEGATIVE
Nitrite, UA: NEGATIVE
POC,PROTEIN,UA: NEGATIVE
Spec Grav, UA: 1.02 (ref 1.010–1.025)
Urobilinogen, UA: 0.2 E.U./dL
pH, UA: 7 (ref 5.0–8.0)

## 2019-05-25 NOTE — Addendum Note (Signed)
Addended by: Raliegh Ip on: 05/25/2019 06:09 PM   Modules accepted: Orders

## 2019-05-25 NOTE — Progress Notes (Signed)
ROB doing well. Follow up u/s was postponed due tech being out. She is going to reschedule today. Discussed 28 wk labs she verbalizes and agrees to plan. Declines flu until next visit. Follow for u/s and ROB in 4 wks.   Philip Aspen, CNM

## 2019-05-25 NOTE — Patient Instructions (Signed)

## 2019-06-19 ENCOUNTER — Other Ambulatory Visit: Payer: Self-pay

## 2019-06-19 ENCOUNTER — Ambulatory Visit (INDEPENDENT_AMBULATORY_CARE_PROVIDER_SITE_OTHER): Payer: BLUE CROSS/BLUE SHIELD | Admitting: Obstetrics and Gynecology

## 2019-06-19 ENCOUNTER — Ambulatory Visit (INDEPENDENT_AMBULATORY_CARE_PROVIDER_SITE_OTHER): Payer: BLUE CROSS/BLUE SHIELD

## 2019-06-19 ENCOUNTER — Other Ambulatory Visit: Payer: BLUE CROSS/BLUE SHIELD

## 2019-06-19 VITALS — BP 113/80 | HR 78 | Wt 132.7 lb

## 2019-06-19 DIAGNOSIS — Z3402 Encounter for supervision of normal first pregnancy, second trimester: Secondary | ICD-10-CM

## 2019-06-19 DIAGNOSIS — Z3492 Encounter for supervision of normal pregnancy, unspecified, second trimester: Secondary | ICD-10-CM

## 2019-06-19 DIAGNOSIS — Z3493 Encounter for supervision of normal pregnancy, unspecified, third trimester: Secondary | ICD-10-CM

## 2019-06-19 NOTE — Patient Instructions (Signed)
Third Trimester of Pregnancy The third trimester is from week 28 through week 40 (months 7 through 9). The third trimester is a time when the unborn baby (fetus) is growing rapidly. At the end of the ninth month, the fetus is about 20 inches in length and weighs 6-10 pounds. Body changes during your third trimester Your body will continue to go through many changes during pregnancy. The changes vary from woman to woman. During the third trimester:  Your weight will continue to increase. You can expect to gain 25-35 pounds (11-16 kg) by the end of the pregnancy.  You may begin to get stretch marks on your hips, abdomen, and breasts.  You may urinate more often because the fetus is moving lower into your pelvis and pressing on your bladder.  You may develop or continue to have heartburn. This is caused by increased hormones that slow down muscles in the digestive tract.  You may develop or continue to have constipation because increased hormones slow digestion and cause the muscles that push waste through your intestines to relax.  You may develop hemorrhoids. These are swollen veins (varicose veins) in the rectum that can itch or be painful.  You may develop swollen, bulging veins (varicose veins) in your legs.  You may have increased body aches in the pelvis, back, or thighs. This is due to weight gain and increased hormones that are relaxing your joints.  You may have changes in your hair. These can include thickening of your hair, rapid growth, and changes in texture. Some women also have hair loss during or after pregnancy, or hair that feels dry or thin. Your hair will most likely return to normal after your baby is born.  Your breasts will continue to grow and they will continue to become tender. A yellow fluid (colostrum) may leak from your breasts. This is the first milk you are producing for your baby.  Your belly button may stick out.  You may notice more swelling in your hands,  face, or ankles.  You may have increased tingling or numbness in your hands, arms, and legs. The skin on your belly may also feel numb.  You may feel short of breath because of your expanding uterus.  You may have more problems sleeping. This can be caused by the size of your belly, increased need to urinate, and an increase in your body's metabolism.  You may notice the fetus "dropping," or moving lower in your abdomen (lightening).  You may have increased vaginal discharge.  You may notice your joints feel loose and you may have pain around your pelvic bone. What to expect at prenatal visits You will have prenatal exams every 2 weeks until week 36. Then you will have weekly prenatal exams. During a routine prenatal visit:  You will be weighed to make sure you and the baby are growing normally.  Your blood pressure will be taken.  Your abdomen will be measured to track your baby's growth.  The fetal heartbeat will be listened to.  Any test results from the previous visit will be discussed.  You may have a cervical check near your due date to see if your cervix has softened or thinned (effaced).  You will be tested for Group B streptococcus. This happens between 35 and 37 weeks. Your health care provider may ask you:  What your birth plan is.  How you are feeling.  If you are feeling the baby move.  If you have had any abnormal   symptoms, such as leaking fluid, bleeding, severe headaches, or abdominal cramping.  If you are using any tobacco products, including cigarettes, chewing tobacco, and electronic cigarettes.  If you have any questions. Other tests or screenings that may be performed during your third trimester include:  Blood tests that check for low iron levels (anemia).  Fetal testing to check the health, activity level, and growth of the fetus. Testing is done if you have certain medical conditions or if there are problems during the pregnancy.  Nonstress test  (NST). This test checks the health of your baby to make sure there are no signs of problems, such as the baby not getting enough oxygen. During this test, a belt is placed around your belly. The baby is made to move, and its heart rate is monitored during movement. What is false labor? False labor is a condition in which you feel small, irregular tightenings of the muscles in the womb (contractions) that usually go away with rest, changing position, or drinking water. These are called Braxton Hicks contractions. Contractions may last for hours, days, or even weeks before true labor sets in. If contractions come at regular intervals, become more frequent, increase in intensity, or become painful, you should see your health care provider. What are the signs of labor?  Abdominal cramps.  Regular contractions that start at 10 minutes apart and become stronger and more frequent with time.  Contractions that start on the top of the uterus and spread down to the lower abdomen and back.  Increased pelvic pressure and dull back pain.  A watery or bloody mucus discharge that comes from the vagina.  Leaking of amniotic fluid. This is also known as your "water breaking." It could be a slow trickle or a gush. Let your health care provider know if it has a color or strange odor. If you have any of these signs, call your health care provider right away, even if it is before your due date. Follow these instructions at home: Medicines  Follow your health care provider's instructions regarding medicine use. Specific medicines may be either safe or unsafe to take during pregnancy.  Take a prenatal vitamin that contains at least 600 micrograms (mcg) of folic acid.  If you develop constipation, try taking a stool softener if your health care provider approves. Eating and drinking   Eat a balanced diet that includes fresh fruits and vegetables, whole grains, good sources of protein such as meat, eggs, or tofu,  and low-fat dairy. Your health care provider will help you determine the amount of weight gain that is right for you.  Avoid raw meat and uncooked cheese. These carry germs that can cause birth defects in the baby.  If you have low calcium intake from food, talk to your health care provider about whether you should take a daily calcium supplement.  Eat four or five small meals rather than three large meals a day.  Limit foods that are high in fat and processed sugars, such as fried and sweet foods.  To prevent constipation: ? Drink enough fluid to keep your urine clear or pale yellow. ? Eat foods that are high in fiber, such as fresh fruits and vegetables, whole grains, and beans. Activity  Exercise only as directed by your health care provider. Most women can continue their usual exercise routine during pregnancy. Try to exercise for 30 minutes at least 5 days a week. Stop exercising if you experience uterine contractions.  Avoid heavy lifting.  Do   not exercise in extreme heat or humidity, or at high altitudes.  Wear low-heel, comfortable shoes.  Practice good posture.  You may continue to have sex unless your health care provider tells you otherwise. Relieving pain and discomfort  Take frequent breaks and rest with your legs elevated if you have leg cramps or low back pain.  Take warm sitz baths to soothe any pain or discomfort caused by hemorrhoids. Use hemorrhoid cream if your health care provider approves.  Wear a good support bra to prevent discomfort from breast tenderness.  If you develop varicose veins: ? Wear support pantyhose or compression stockings as told by your healthcare provider. ? Elevate your feet for 15 minutes, 3-4 times a day. Prenatal care  Write down your questions. Take them to your prenatal visits.  Keep all your prenatal visits as told by your health care provider. This is important. Safety  Wear your seat belt at all times when driving.  Make  a list of emergency phone numbers, including numbers for family, friends, the hospital, and police and fire departments. General instructions  Avoid cat litter boxes and soil used by cats. These carry germs that can cause birth defects in the baby. If you have a cat, ask someone to clean the litter box for you.  Do not travel far distances unless it is absolutely necessary and only with the approval of your health care provider.  Do not use hot tubs, steam rooms, or saunas.  Do not drink alcohol.  Do not use any products that contain nicotine or tobacco, such as cigarettes and e-cigarettes. If you need help quitting, ask your health care provider.  Do not use any medicinal herbs or unprescribed drugs. These chemicals affect the formation and growth of the baby.  Do not douche or use tampons or scented sanitary pads.  Do not cross your legs for long periods of time.  To prepare for the arrival of your baby: ? Take prenatal classes to understand, practice, and ask questions about labor and delivery. ? Make a trial run to the hospital. ? Visit the hospital and tour the maternity area. ? Arrange for maternity or paternity leave through employers. ? Arrange for family and friends to take care of pets while you are in the hospital. ? Purchase a rear-facing car seat and make sure you know how to install it in your car. ? Pack your hospital bag. ? Prepare the baby's nursery. Make sure to remove all pillows and stuffed animals from the baby's crib to prevent suffocation.  Visit your dentist if you have not gone during your pregnancy. Use a soft toothbrush to brush your teeth and be gentle when you floss. Contact a health care provider if:  You are unsure if you are in labor or if your water has broken.  You become dizzy.  You have mild pelvic cramps, pelvic pressure, or nagging pain in your abdominal area.  You have lower back pain.  You have persistent nausea, vomiting, or diarrhea.   You have an unusual or bad smelling vaginal discharge.  You have pain when you urinate. Get help right away if:  Your water breaks before 37 weeks.  You have regular contractions less than 5 minutes apart before 37 weeks.  You have a fever.  You are leaking fluid from your vagina.  You have spotting or bleeding from your vagina.  You have severe abdominal pain or cramping.  You have rapid weight loss or weight gain.  You have   shortness of breath with chest pain.  You notice sudden or extreme swelling of your face, hands, ankles, feet, or legs.  Your baby makes fewer than 10 movements in 2 hours.  You have severe headaches that do not go away when you take medicine.  You have vision changes. Summary  The third trimester is from week 28 through week 40, months 7 through 9. The third trimester is a time when the unborn baby (fetus) is growing rapidly.  During the third trimester, your discomfort may increase as you and your baby continue to gain weight. You may have abdominal, leg, and back pain, sleeping problems, and an increased need to urinate.  During the third trimester your breasts will keep growing and they will continue to become tender. A yellow fluid (colostrum) may leak from your breasts. This is the first milk you are producing for your baby.  False labor is a condition in which you feel small, irregular tightenings of the muscles in the womb (contractions) that eventually go away. These are called Braxton Hicks contractions. Contractions may last for hours, days, or even weeks before true labor sets in.  Signs of labor can include: abdominal cramps; regular contractions that start at 10 minutes apart and become stronger and more frequent with time; watery or bloody mucus discharge that comes from the vagina; increased pelvic pressure and dull back pain; and leaking of amniotic fluid. This information is not intended to replace advice given to you by your health  care provider. Make sure you discuss any questions you have with your health care provider. Document Released: 08/24/2001 Document Revised: 12/21/2018 Document Reviewed: 10/05/2016 [employer] Patient Education  2020 [employer]  

## 2019-06-19 NOTE — Progress Notes (Signed)
ROB, glucola and completing anatomy scan this visit, doing well without concerns. Reviewed anatomy scan below: desires waiting on Tdap till next visit. Encouraged to enroll in classes.   Indications: Anatomy follow up ultrasound Findings:  Singleton intrauterine pregnancy is visualized with FHR at 150 BPM.  Fetal presentation is Transverse.  Placenta: anterior. Grade: 1 AFI: subjectively normal.  Anatomic survey is complete.   There is no free peritoneal fluid in the cul de sac.  Impression: 1. [redacted]w[redacted]d Viable Singleton Intrauterine pregnancy previously established criteria. 2. Normal Anatomy Scan is now complete

## 2019-06-19 NOTE — Progress Notes (Signed)
ROB- glucola done, blood consent signed, pt is doing well 

## 2019-06-20 ENCOUNTER — Other Ambulatory Visit: Payer: Self-pay | Admitting: Certified Nurse Midwife

## 2019-06-20 DIAGNOSIS — O99810 Abnormal glucose complicating pregnancy: Secondary | ICD-10-CM

## 2019-06-20 LAB — URINALYSIS, ROUTINE W REFLEX MICROSCOPIC
Bilirubin, UA: NEGATIVE
Glucose, UA: NEGATIVE
Ketones, UA: NEGATIVE
Leukocytes,UA: NEGATIVE
Nitrite, UA: NEGATIVE
Protein,UA: NEGATIVE
RBC, UA: NEGATIVE
Specific Gravity, UA: 1.018 (ref 1.005–1.030)
Urobilinogen, Ur: 0.2 mg/dL (ref 0.2–1.0)
pH, UA: 7 (ref 5.0–7.5)

## 2019-06-20 LAB — MONITOR DRUG PROFILE 14(MW)
Amphetamine Scrn, Ur: NEGATIVE ng/mL
BARBITURATE SCREEN URINE: NEGATIVE ng/mL
BENZODIAZEPINE SCREEN, URINE: NEGATIVE ng/mL
Buprenorphine, Urine: NEGATIVE ng/mL
CANNABINOIDS UR QL SCN: NEGATIVE ng/mL
Cocaine (Metab) Scrn, Ur: NEGATIVE ng/mL
Creatinine(Crt), U: 123.7 mg/dL (ref 20.0–300.0)
Fentanyl, Urine: NEGATIVE pg/mL
Meperidine Screen, Urine: NEGATIVE ng/mL
Methadone Screen, Urine: NEGATIVE ng/mL
OXYCODONE+OXYMORPHONE UR QL SCN: NEGATIVE ng/mL
Opiate Scrn, Ur: NEGATIVE ng/mL
Ph of Urine: 7 (ref 4.5–8.9)
Phencyclidine Qn, Ur: NEGATIVE ng/mL
Propoxyphene Scrn, Ur: NEGATIVE ng/mL
SPECIFIC GRAVITY: 1.016
Tramadol Screen, Urine: NEGATIVE ng/mL

## 2019-06-20 LAB — CBC
Hematocrit: 29.6 % — ABNORMAL LOW (ref 34.0–46.6)
Hemoglobin: 10.2 g/dL — ABNORMAL LOW (ref 11.1–15.9)
MCH: 28.6 pg (ref 26.6–33.0)
MCHC: 34.5 g/dL (ref 31.5–35.7)
MCV: 83 fL (ref 79–97)
Platelets: 228 10*3/uL (ref 150–450)
RBC: 3.57 x10E6/uL — ABNORMAL LOW (ref 3.77–5.28)
RDW: 11.9 % (ref 11.7–15.4)
WBC: 6.6 10*3/uL (ref 3.4–10.8)

## 2019-06-20 LAB — GLUCOSE TOLERANCE, 1 HOUR: Glucose, 1Hr PP: 142 mg/dL (ref 65–199)

## 2019-06-20 LAB — RPR: RPR Ser Ql: NONREACTIVE

## 2019-06-20 NOTE — Progress Notes (Signed)
1 hr glucose elevated, 3 hr test ordered. Pt notified.   Philip Aspen, CNM

## 2019-06-21 LAB — URINE CULTURE: Organism ID, Bacteria: NO GROWTH

## 2019-06-22 ENCOUNTER — Encounter: Payer: Self-pay | Admitting: Certified Nurse Midwife

## 2019-06-22 DIAGNOSIS — O99019 Anemia complicating pregnancy, unspecified trimester: Secondary | ICD-10-CM | POA: Insufficient documentation

## 2019-06-22 DIAGNOSIS — R7309 Other abnormal glucose: Secondary | ICD-10-CM | POA: Insufficient documentation

## 2019-06-22 LAB — GC/CHLAMYDIA PROBE AMP
Chlamydia trachomatis, NAA: NEGATIVE
Neisseria Gonorrhoeae by PCR: NEGATIVE

## 2019-07-02 ENCOUNTER — Other Ambulatory Visit: Payer: BLUE CROSS/BLUE SHIELD

## 2019-07-02 ENCOUNTER — Other Ambulatory Visit: Payer: Self-pay

## 2019-07-02 ENCOUNTER — Ambulatory Visit (INDEPENDENT_AMBULATORY_CARE_PROVIDER_SITE_OTHER): Payer: BLUE CROSS/BLUE SHIELD | Admitting: Certified Nurse Midwife

## 2019-07-02 VITALS — BP 114/63 | HR 78 | Wt 133.0 lb

## 2019-07-02 DIAGNOSIS — Z23 Encounter for immunization: Secondary | ICD-10-CM

## 2019-07-02 DIAGNOSIS — Z0289 Encounter for other administrative examinations: Secondary | ICD-10-CM

## 2019-07-02 DIAGNOSIS — O99810 Abnormal glucose complicating pregnancy: Secondary | ICD-10-CM

## 2019-07-02 DIAGNOSIS — Z3493 Encounter for supervision of normal pregnancy, unspecified, third trimester: Secondary | ICD-10-CM

## 2019-07-02 LAB — POCT URINALYSIS DIPSTICK OB
Bilirubin, UA: NEGATIVE
Blood, UA: NEGATIVE
Glucose, UA: NEGATIVE
Ketones, UA: NEGATIVE
Leukocytes, UA: NEGATIVE
Nitrite, UA: NEGATIVE
POC,PROTEIN,UA: NEGATIVE
Spec Grav, UA: 1.015 (ref 1.010–1.025)
Urobilinogen, UA: 0.2 E.U./dL
pH, UA: 6.5 (ref 5.0–8.0)

## 2019-07-02 MED ORDER — TETANUS-DIPHTH-ACELL PERTUSSIS 5-2.5-18.5 LF-MCG/0.5 IM SUSP
0.5000 mL | Freq: Once | INTRAMUSCULAR | Status: AC
  Administered 2019-07-02: 0.5 mL via INTRAMUSCULAR

## 2019-07-02 NOTE — Patient Instructions (Signed)
Contraception Choices °Contraception, also called birth control, means things to use or ways to try not to get pregnant. °Hormonal birth control °This kind of birth control uses hormones. Here are some types of hormonal birth control: °· A tube that is put under skin of the arm (implant). The tube can stay in for as long as 3 years. °· Shots to get every 3 months (injections). °· Pills to take every day (birth control pills). °· A patch to change 1 time each week for 3 weeks (birth control patch). After that, the patch is taken off for 1 week. °· A ring to put in the vagina. The ring is left in for 3 weeks. Then it is taken out of the vagina for 1 week. Then a new ring is put in. °· Pills to take after unprotected sex (emergency birth control pills). °Barrier birth control °Here are some types of barrier birth control: °· A thin covering that is put on the penis before sex (female condom). The covering is thrown away after sex. °· A soft, loose covering that is put in the vagina before sex (female condom). The covering is thrown away after sex. °· A rubber bowl that sits over the cervix (diaphragm). The bowl must be made for you. The bowl is put into the vagina before sex. The bowl is left in for 6-8 hours after sex. It is taken out within 24 hours. °· A small, soft cup that fits over the cervix (cervical cap). The cup must be made for you. The cup can be left in for 6-8 hours after sex. It is taken out within 48 hours. °· A sponge that is put into the vagina before sex. It must be left in for at least 6 hours after sex. It must be taken out within 30 hours. Then it is thrown away. °· A chemical that kills or stops sperm from getting into the uterus (spermicide). It may be a pill, cream, jelly, or foam to put in the vagina. The chemical should be used at least 10-15 minutes before sex. °IUD (intrauterine) birth control °An IUD is a small, T-shaped piece of plastic. It is put inside the uterus. There are two  kinds: °· Hormone IUD. This kind can stay in for 3-5 years. °· Copper IUD. This kind can stay in for 10 years. °Permanent birth control °Here are some types of permanent birth control: °· Surgery to block the fallopian tubes. °· Having an insert put into each fallopian tube. °· Surgery to tie off the tubes that carry sperm (vasectomy). °Natural planning birth control °Here are some types of natural planning birth control: °· Not having sex on the days the woman could get pregnant. °· Using a calendar: °? To keep track of the length of each period. °? To find out what days pregnancy can happen. °? To plan to not have sex on days when pregnancy can happen. °· Watching for symptoms of ovulation and not having sex during ovulation. One way the woman can check for ovulation is to check her temperature. °· Waiting to have sex until after ovulation. °Summary °· Contraception, also called birth control, means things to use or ways to try not to get pregnant. °· Hormonal methods of birth control include implants, injections, pills, patches, vaginal rings, and emergency birth control pills. °· Barrier methods of birth control can include female condoms, female condoms, diaphragms, cervical caps, sponges, and spermicides. °· There are two types of IUD (intrauterine device) birth control.   An IUD can be put in a woman's uterus to prevent pregnancy for 3-5 years.  Permanent sterilization can be done through a procedure for males, females, or both.  Natural planning methods involve not having sex on the days when the woman could get pregnant. This information is not intended to replace advice given to you by your health care provider. Make sure you discuss any questions you have with your health care provider. Document Released: 06/27/2009 Document Revised: 12/20/2018 Document Reviewed: 09/09/2016 Elsevier Patient Education  2020 [employer]   WHAT OB PATIENTS CAN EXPECT   Confirmation of pregnancy and ultrasound  ordered if medically indicated-[redacted] weeks gestation  New OB (NOB) intake with nurse and New OB (NOB) labs- [redacted] weeks gestation  New OB (NOB) physical examination with provider- 11/[redacted] weeks gestation  Flu vaccine-[redacted] weeks gestation  Anatomy scan-[redacted] weeks gestation  Glucose tolerance test, blood work to test for anemia, T-dap vaccine-[redacted] weeks gestation  Vaginal swabs/cultures-STD/Group B strep-[redacted] weeks gestation  Appointments every 4 weeks until 28 weeks  Every 2 weeks from 28 weeks until 36 weeks  Weekly visits from 36 weeks until delivery    Round Ligament Pain  The round ligament is a cord of muscle and tissue that helps support the uterus. It can become a source of pain during pregnancy if it becomes stretched or twisted as the baby grows. The pain usually begins in the second trimester (13-28 weeks) of pregnancy, and it can come and go until the baby is delivered. It is not a serious problem, and it does not cause harm to the baby. Round ligament pain is usually a short, sharp, and pinching pain, but it can also be a dull, lingering, and aching pain. The pain is felt in the lower side of the abdomen or in the groin. It usually starts deep in the groin and moves up to the outside of the hip area. The pain may occur when you:  Suddenly change position, such as quickly going from a sitting to standing position.  Roll over in bed.  Cough or sneeze.  Do physical activity. Follow these instructions at home:   Watch your condition for any changes.  When the pain starts, relax. Then try any of these methods to help with the pain: ? Sitting down. ? Flexing your knees up to your abdomen. ? Lying on your side with one pillow under your abdomen and another pillow between your legs. ? Sitting in a warm bath for 15-20 minutes or until the pain goes away.  Take over-the-counter and prescription medicines only as told by your health care provider.  Move slowly when you sit down or stand  up.  Avoid long walks if they cause pain.  Stop or reduce your physical activities if they cause pain.  Keep all follow-up visits as told by your health care provider. This is important. Contact a health care provider if:  Your pain does not go away with treatment.  You feel pain in your back that you did not have before.  Your medicine is not helping. Get help right away if:  You have a fever or chills.  You develop uterine contractions.  You have vaginal bleeding.  You have nausea or vomiting.  You have diarrhea.  You have pain when you urinate. Summary  Round ligament pain is felt in the lower abdomen or groin. It is usually a short, sharp, and pinching pain. It can also be a dull, lingering, and aching pain.  This  pain usually begins in the second trimester (13-28 weeks). It occurs because the uterus is stretching with the growing baby, and it is not harmful to the baby.  You may notice the pain when you suddenly change position, when you cough or sneeze, or during physical activity.  Relaxing, flexing your knees to your abdomen, lying on one side, or taking a warm bath may help to get rid of the pain.  Get help from your health care provider if the pain does not go away or if you have vaginal bleeding, nausea, vomiting, diarrhea, or painful urination. This information is not intended to replace advice given to you by your health care provider. Make sure you discuss any questions you have with your health care provider. Document Released: 06/08/2008 Document Revised: 02/15/2018 Document Reviewed: 02/15/2018 Elsevier Patient Education  2020 [employer]   Back Pain in Pregnancy Back pain during pregnancy is common. Back pain may be caused by several factors that are related to changes during your pregnancy. Follow these instructions at home: Managing pain, stiffness, and swelling      If directed, for sudden (acute) back pain, put ice on the painful  area. ? Put ice in a plastic bag. ? Place a towel between your skin and the bag. ? Leave the ice on for 20 minutes, 2-3 times per day.  If directed, apply heat to the affected area before you exercise. Use the heat source that your health care provider recommends, such as a moist heat pack or a heating pad. ? Place a towel between your skin and the heat source. ? Leave the heat on for 20-30 minutes. ? Remove the heat if your skin turns bright red. This is especially important if you are unable to feel pain, heat, or cold. You may have a greater risk of getting burned.  If directed, massage the affected area. Activity  Exercise as told by your health care provider. Gentle exercise is the best way to prevent or manage back pain.  Listen to your body when lifting. If lifting hurts, ask for help or bend your knees. This uses your leg muscles instead of your back muscles.  Squat down when picking up something from the floor. Do not bend over.  Only use bed rest for short periods as told by your health care provider. Bed rest should only be used for the most severe episodes of back pain. Standing, sitting, and lying down  Do not stand in one place for long periods of time.  Use good posture when sitting. Make sure your head rests over your shoulders and is not hanging forward. Use a pillow on your lower back if necessary.  Try sleeping on your side, preferably the left side, with a pregnancy support pillow or 1-2 regular pillows between your legs. ? If you have back pain after a night's rest, your bed may be too soft. ? A firm mattress may provide more support for your back during pregnancy. General instructions  Do not wear high heels.  Eat a healthy diet. Try to gain weight within your health care provider's recommendations.  Use a maternity girdle, elastic sling, or back brace as told by your health care provider.  Take over-the-counter and prescription medicines only as told by  your health care provider.  Work with a physical therapist or massage therapist to find ways to manage back pain. Acupuncture or massage therapy may be helpful.  Keep all follow-up visits as told by your health care provider.  This is important. Contact a health care provider if:  Your back pain interferes with your daily activities.  You have increasing pain in other parts of your body. Get help right away if:  You develop numbness, tingling, weakness, or problems with the use of your arms or legs.  You develop severe back pain that is not controlled with medicine.  You have a change in bowel or bladder control.  You develop shortness of breath, dizziness, or you faint.  You develop nausea, vomiting, or sweating.  You have back pain that is a rhythmic, cramping pain similar to labor pains. Labor pain is usually 1-2 minutes apart, lasts for about 1 minute, and involves a bearing down feeling or pressure in your pelvis.  You have back pain and your water breaks or you have vaginal bleeding.  You have back pain or numbness that travels down your leg.  Your back pain developed after you fell.  You develop pain on one side of your back.  You see blood in your urine.  You develop skin blisters in the area of your back pain. Summary  Back pain may be caused by several factors that are related to changes during your pregnancy.  Follow instructions as told by your health care provider for managing pain, stiffness, and swelling.  Exercise as told by your health care provider. Gentle exercise is the best way to prevent or manage back pain.  Take over-the-counter and prescription medicines only as told by your health care provider.  Keep all follow-up visits as told by your health care provider. This is important. This information is not intended to replace advice given to you by your health care provider. Make sure you discuss any questions you have with your health care  provider. Document Released: 12/08/2005 Document Revised: 12/19/2018 Document Reviewed: 02/15/2018 Elsevier Patient Education  2020 [employer] Inc.   Pain Relief During Labor and Delivery Many things can cause pain during labor and delivery, including:  Pressure on bones and ligaments due to the baby moving through the pelvis.  Stretching of tissues due to the baby moving through the birth canal.  Muscle tension due to anxiety or nervousness.  The uterus tightening (contracting) and relaxing to help move the baby. There are many ways to deal with the pain of labor and delivery. They include:  Taking prenatal classes. Taking these classes helps you know what to expect during your babys birth. What you learn will increase your confidence and decrease your anxiety.  Practicing relaxation techniques or doing relaxing activities, such as: ? Focused breathing. ? Meditation. ? Visualization. ? Aroma therapy. ? Listening to your favorite music. ? Hypnosis.  Taking a warm shower or bath (hydrotherapy). This may: ? Provide comfort and relaxation. ? Lessen your perception of pain. ? Decrease the amount of pain medicine needed. ? Decrease the length of labor.  Getting a massage or counterpressure on your back.  Applying warm packs or ice packs.  Changing positions often, moving around, or using a birthing ball.  Getting: ? Pain medicine through an IV or injection into a muscle. ? Pain medicine inserted into your spinal column. ? Injections of sterile water just under the skin on your lower back (intradermal injections). ? Laughing gas (nitrous oxide). Discuss your pain control options with your health care provider during your prenatal visits. Explore the options offered by your hospital or birth center. What kinds of medicine are available? There are two kinds of medicines that can be used to  relieve pain during labor and delivery:  Analgesics. These medicines decrease pain  without causing you to lose feeling or the ability to move your muscles.  Anesthetics. These medicines block feeling in the body and can decrease your ability to move freely. Both of these kinds of medicine can cause minor side effects, such as nausea, trouble concentrating, and sleepiness. They can also decrease the baby's heart rate before birth and affect the babys breathing rate after birth. For this reason, health care providers are careful about when and how much medicine is given. What are specific medicines and procedures that provide pain relief? Local Anesthetics Local anesthetics are used to numb a small area of the body. They may be used along with another kind of anesthetic or used to numb the nerves of the vagina, cervix, and perineum during the second stage of labor. General Anesthetics General anesthetics cause you to lose consciousness so you do not feel pain. They are usually only used for an emergency cesarean delivery. General anesthetics are given through an IV tube and a mask. Pudendal Block A pudendal block is a form of local anesthetic. It may be used to relieve the pain associated with pushing or stretching of the perineum at the time of delivery or to further numb the perineum. A pudendal block is done by injecting numbing medicine through the vaginal wall into a nerve in the pelvis. Epidural Analgesia Epidural analgesia is given through a flexible IV catheter that is inserted into the lower back. Numbing medicine is delivered continuously to the area near your spinal column nerves (epidural space). After having this type of analgesia, you may be able to move your legs but you most likely will not be able to walk. Depending on the amount of medicine given, you may lose all feeling in the lower half of your body, or you may retain some level of sensation, including the urge to push. Epidural analgesia can be used to provide pain relief for a vaginal birth. Spinal Block A  spinal block is similar to epidural analgesia, but the medicine is injected into the spinal fluid instead of the epidural space. A spinal block is only given once. It starts to relieve pain quickly, but the pain relief lasts only 1-6 hours. Spinal blocks can be used for cesarean deliveries. Combined Spinal-Epidural (CSE) Block A CSE block combines the effects of a spinal block and epidural analgesia. The spinal block works quickly to block all pain. The epidural analgesia provides continuous pain relief, even after the effects of the spinal block have worn off. This information is not intended to replace advice given to you by your health care provider. Make sure you discuss any questions you have with your health care provider. Document Released: 12/16/2008 Document Revised: 08/12/2017 Document Reviewed: 01/21/2016 Elsevier Patient Education  2020 [employer].   [facility]  [address] [place], [place], [place] [address]  Phone: [phone]   [facility] (second location)  [address] [place], [place] [address]  Phone: [phone]   [facility] [address] [place], [place] [address] Phone: [phone]   [facility]  [address] [place], [place] [address]  Phone: [phone]  Breastfeeding  Choosing to breastfeed is one of the best decisions you can make for yourself and your baby. A change in hormones during pregnancy causes your breasts to make breast milk in your milk-producing glands. Hormones prevent breast milk from being released  before your baby is born. They also prompt milk flow after birth. Once breastfeeding has begun, thoughts of your baby, as well as his or her sucking or crying, can stimulate the release of milk from your milk-producing glands. Benefits of breastfeeding Research shows that breastfeeding offers many health benefits for infants and mothers. It also offers  a cost-free and convenient way to feed your baby. For your baby  Your first milk (colostrum) helps your baby's digestive system to function better.  Special cells in your milk (antibodies) help your baby to fight off infections.  Breastfed babies are less likely to develop asthma, allergies, obesity, or type 2 diabetes. They are also at lower risk for sudden infant death syndrome (SIDS).  Nutrients in breast milk are better able to meet your babys needs compared to infant formula.  Breast milk improves your baby's brain development. For you  Breastfeeding helps to create a very special bond between you and your baby.  Breastfeeding is convenient. Breast milk costs nothing and is always available at the correct temperature.  Breastfeeding helps to burn calories. It helps you to lose the weight that you gained during pregnancy.  Breastfeeding makes your uterus return faster to its size before pregnancy. It also slows bleeding (lochia) after you give birth.  Breastfeeding helps to lower your risk of developing type 2 diabetes, osteoporosis, rheumatoid arthritis, cardiovascular disease, and breast, ovarian, uterine, and endometrial cancer later in life. Breastfeeding basics Starting breastfeeding  Find a comfortable place to sit or lie down, with your neck and back well-supported.  Place a pillow or a rolled-up blanket under your baby to bring him or her to the level of your breast (if you are seated). Nursing pillows are specially designed to help support your arms and your baby while you breastfeed.  Make sure that your baby's tummy (abdomen) is facing your abdomen.  Gently massage your breast. With your fingertips, massage from the outer edges of your breast inward toward the nipple. This encourages milk flow. If your milk flows slowly, you may need to continue this action during the feeding.  Support your breast with 4 fingers underneath and your thumb above your nipple (make the  letter "C" with your hand). Make sure your fingers are well away from your nipple and your babys mouth.  Stroke your baby's lips gently with your finger or nipple.  When your baby's mouth is open wide enough, quickly bring your baby to your breast, placing your entire nipple and as much of the areola as possible into your baby's mouth. The areola is the colored area around your nipple. ? More areola should be visible above your baby's upper lip than below the lower lip. ? Your baby's lips should be opened and extended outward (flanged) to ensure an adequate, comfortable latch. ? Your baby's tongue should be between his or her lower gum and your breast.  Make sure that your baby's mouth is correctly positioned around your nipple (latched). Your baby's lips should create a seal on your breast and be turned out (everted).  It is common for your baby to suck about 2-3 minutes in order to start the flow of breast milk. Latching Teaching your baby how to latch onto your breast properly is very important. An improper latch can cause nipple pain, decreased milk supply, and poor weight gain in your baby. Also, if your baby is not latched onto your nipple properly, he or she may swallow some air during feeding. This can make  your baby fussy. Burping your baby when you switch breasts during the feeding can help to get rid of the air. However, teaching your baby to latch on properly is still the best way to prevent fussiness from swallowing air while breastfeeding. Signs that your baby has successfully latched onto your nipple  Silent tugging or silent sucking, without causing you pain. Infant's lips should be extended outward (flanged).  Swallowing heard between every 3-4 sucks once your milk has started to flow (after your let-down milk reflex occurs).  Muscle movement above and in front of his or her ears while sucking. Signs that your baby has not successfully latched onto your nipple  Sucking sounds  or smacking sounds from your baby while breastfeeding.  Nipple pain. If you think your baby has not latched on correctly, slip your finger into the corner of your babys mouth to break the suction and place it between your baby's gums. Attempt to start breastfeeding again. Signs of successful breastfeeding Signs from your baby  Your baby will gradually decrease the number of sucks or will completely stop sucking.  Your baby will fall asleep.  Your baby's body will relax.  Your baby will retain a small amount of milk in his or her mouth.  Your baby will let go of your breast by himself or herself. Signs from you  Breasts that have increased in firmness, weight, and size 1-3 hours after feeding.  Breasts that are softer immediately after breastfeeding.  Increased milk volume, as well as a change in milk consistency and color by the fifth day of breastfeeding.  Nipples that are not sore, cracked, or bleeding. Signs that your baby is getting enough milk  Wetting at least 1-2 diapers during the first 24 hours after birth.  Wetting at least 5-6 diapers every 24 hours for the first week after birth. The urine should be clear or pale yellow by the age of 5 days.  Wetting 6-8 diapers every 24 hours as your baby continues to grow and develop.  At least 3 stools in a 24-hour period by the age of 5 days. The stool should be soft and yellow.  At least 3 stools in a 24-hour period by the age of 7 days. The stool should be seedy and yellow.  No loss of weight greater than 10% of birth weight during the first 3 days of life.  Average weight gain of 4-7 oz (113-198 g) per week after the age of 4 days.  Consistent daily weight gain by the age of 5 days, without weight loss after the age of 2 weeks. After a feeding, your baby may spit up a small amount of milk. This is normal. Breastfeeding frequency and duration Frequent feeding will help you make more milk and can prevent sore nipples and  extremely full breasts (breast engorgement). Breastfeed when you feel the need to reduce the fullness of your breasts or when your baby shows signs of hunger. This is called "breastfeeding on demand." Signs that your baby is hungry include:  Increased alertness, activity, or restlessness.  Movement of the head from side to side.  Opening of the mouth when the corner of the mouth or cheek is stroked (rooting).  Increased sucking sounds, smacking lips, cooing, sighing, or squeaking.  Hand-to-mouth movements and sucking on fingers or hands.  Fussing or crying. Avoid introducing a pacifier to your baby in the first 4-6 weeks after your baby is born. After this time, you may choose to use a  pacifier. Research has shown that pacifier use during the first year of a baby's life decreases the risk of sudden infant death syndrome (SIDS). Allow your baby to feed on each breast as long as he or she wants. When your baby unlatches or falls asleep while feeding from the first breast, offer the second breast. Because newborns are often sleepy in the first few weeks of life, you may need to awaken your baby to get him or her to feed. Breastfeeding times will vary from baby to baby. However, the following rules can serve as a guide to help you make sure that your baby is properly fed:  Newborns (babies 69 weeks of age or younger) may breastfeed every 1-3 hours.  Newborns should not go without breastfeeding for longer than 3 hours during the day or 5 hours during the night.  You should breastfeed your baby a minimum of 8 times in a 24-hour period. Breast milk pumping     Pumping and storing breast milk allows you to make sure that your baby is exclusively fed your breast milk, even at times when you are unable to breastfeed. This is especially important if you go back to work while you are still breastfeeding, or if you are not able to be present during feedings. Your lactation consultant can help you find a  method of pumping that works best for you and give you guidelines about how long it is safe to store breast milk. Caring for your breasts while you breastfeed Nipples can become dry, cracked, and sore while breastfeeding. The following recommendations can help keep your breasts moisturized and healthy:  Avoid using soap on your nipples.  Wear a supportive bra designed especially for nursing. Avoid wearing underwire-style bras or extremely tight bras (sports bras).  Air-dry your nipples for 3-4 minutes after each feeding.  Use only cotton bra pads to absorb leaked breast milk. Leaking of breast milk between feedings is normal.  Use lanolin on your nipples after breastfeeding. Lanolin helps to maintain your skin's normal moisture barrier. Pure lanolin is not harmful (not toxic) to your baby. You may also hand express a few drops of breast milk and gently massage that milk into your nipples and allow the milk to air-dry. In the first few weeks after giving birth, some women experience breast engorgement. Engorgement can make your breasts feel heavy, warm, and tender to the touch. Engorgement peaks within 3-5 days after you give birth. The following recommendations can help to ease engorgement:  Completely empty your breasts while breastfeeding or pumping. You may want to start by applying warm, moist heat (in the shower or with warm, water-soaked hand towels) just before feeding or pumping. This increases circulation and helps the milk flow. If your baby does not completely empty your breasts while breastfeeding, pump any extra milk after he or she is finished.  Apply ice packs to your breasts immediately after breastfeeding or pumping, unless this is too uncomfortable for you. To do this: ? Put ice in a plastic bag. ? Place a towel between your skin and the bag. ? Leave the ice on for 20 minutes, 2-3 times a day.  Make sure that your baby is latched on and positioned properly while  breastfeeding. If engorgement persists after 48 hours of following these recommendations, contact your health care provider or a Science writer. Overall health care recommendations while breastfeeding  Eat 3 healthy meals and 3 snacks every day. Well-nourished mothers who are breastfeeding need an additional  450-500 calories a day. You can meet this requirement by increasing the amount of a balanced diet that you eat.  Drink enough water to keep your urine pale yellow or clear.  Rest often, relax, and continue to take your prenatal vitamins to prevent fatigue, stress, and low vitamin and mineral levels in your body (nutrient deficiencies).  Do not use any products that contain nicotine or tobacco, such as cigarettes and e-cigarettes. Your baby may be harmed by chemicals from cigarettes that pass into breast milk and exposure to secondhand smoke. If you need help quitting, ask your health care provider.  Avoid alcohol.  Do not use illegal drugs or marijuana.  Talk with your health care provider before taking any medicines. These include over-the-counter and prescription medicines as well as vitamins and herbal supplements. Some medicines that may be harmful to your baby can pass through breast milk.  It is possible to become pregnant while breastfeeding. If birth control is desired, ask your health care provider about options that will be safe while breastfeeding your baby. Where to find more information: [employer]: www.llli.org Contact a health care provider if:  You feel like you want to stop breastfeeding or have become frustrated with breastfeeding.  Your nipples are cracked or bleeding.  Your breasts are red, tender, or warm.  You have: ? Painful breasts or nipples. ? A swollen area on either breast. ? A fever or chills. ? Nausea or vomiting. ? Drainage other than breast milk from your nipples.  Your breasts do not become full before feedings by the  fifth day after you give birth.  You feel sad and depressed.  Your baby is: ? Too sleepy to eat well. ? Having trouble sleeping. ? More than 64 week old and wetting fewer than 6 diapers in a 24-hour period. ? Not gaining weight by 45 days of age.  Your baby has fewer than 3 stools in a 24-hour period.  Your baby's skin or the white parts of his or her eyes become yellow. Get help right away if:  Your baby is overly tired (lethargic) and does not want to wake up and feed.  Your baby develops an unexplained fever. Summary  Breastfeeding offers many health benefits for infant and mothers.  Try to breastfeed your infant when he or she shows early signs of hunger.  Gently tickle or stroke your baby's lips with your finger or nipple to allow the baby to open his or her mouth. Bring the baby to your breast. Make sure that much of the areola is in your baby's mouth. Offer one side and burp the baby before you offer the other side.  Talk with your health care provider or lactation consultant if you have questions or you face problems as you breastfeed. This information is not intended to replace advice given to you by your health care provider. Make sure you discuss any questions you have with your health care provider. Document Released: 08/30/2005 Document Revised: 11/24/2017 Document Reviewed: 10/01/2016 Elsevier Patient Education  2020 [employer].

## 2019-07-02 NOTE — Progress Notes (Signed)
ROB-No complaints.  

## 2019-07-02 NOTE — Progress Notes (Signed)
ROB-Doing well. GTT today, see orders; will contact pt with results. Third trimester handouts provided. TDaP given. Ready, Set, Baby completed. Blood transfusion consent reviewed and signed. Plans epidural, breastfeeding and LAM/NFP. Anticipatory guidance regarding course of prenatal care. Reviewed red flag symptoms and when to call. RTC x 2 weeks for ROB or sooner if needed.

## 2019-07-03 LAB — GESTATIONAL GLUCOSE TOLERANCE
Glucose, Fasting: 94 mg/dL (ref 65–94)
Glucose, GTT - 1 Hour: 176 mg/dL (ref 65–179)
Glucose, GTT - 2 Hour: 149 mg/dL (ref 65–154)
Glucose, GTT - 3 Hour: 121 mg/dL (ref 65–139)

## 2019-07-06 ENCOUNTER — Encounter: Payer: BLUE CROSS/BLUE SHIELD | Admitting: Certified Nurse Midwife

## 2019-07-18 ENCOUNTER — Other Ambulatory Visit: Payer: Self-pay

## 2019-07-18 ENCOUNTER — Ambulatory Visit (INDEPENDENT_AMBULATORY_CARE_PROVIDER_SITE_OTHER): Payer: BLUE CROSS/BLUE SHIELD | Admitting: Certified Nurse Midwife

## 2019-07-18 VITALS — BP 106/56 | HR 68 | Wt 135.2 lb

## 2019-07-18 DIAGNOSIS — Z3403 Encounter for supervision of normal first pregnancy, third trimester: Secondary | ICD-10-CM

## 2019-07-18 LAB — POCT URINALYSIS DIPSTICK OB
Bilirubin, UA: NEGATIVE
Blood, UA: NEGATIVE
Glucose, UA: NEGATIVE
Ketones, UA: NEGATIVE
Leukocytes, UA: NEGATIVE
Nitrite, UA: NEGATIVE
POC,PROTEIN,UA: NEGATIVE
Spec Grav, UA: 1.01 (ref 1.010–1.025)
Urobilinogen, UA: 0.2 E.U./dL
pH, UA: 7 (ref 5.0–8.0)

## 2019-07-18 NOTE — Patient Instructions (Signed)
Braxton Hicks Contractions Contractions of the uterus can occur throughout pregnancy, but they are not always a sign that you are in labor. You may have practice contractions called Braxton Hicks contractions. These false labor contractions are sometimes confused with true labor. What are Braxton Hicks contractions? Braxton Hicks contractions are tightening movements that occur in the muscles of the uterus before labor. Unlike true labor contractions, these contractions do not result in opening (dilation) and thinning of the cervix. Toward the end of pregnancy (32-34 weeks), Braxton Hicks contractions can happen more often and may become stronger. These contractions are sometimes difficult to tell apart from true labor because they can be very uncomfortable. You should not feel embarrassed if you go to the hospital with false labor. Sometimes, the only way to tell if you are in true labor is for your health care provider to look for changes in the cervix. The health care provider will do a physical exam and may monitor your contractions. If you are not in true labor, the exam should show that your cervix is not dilating and your water has not broken. If there are no other health problems associated with your pregnancy, it is completely safe for you to be sent home with false labor. You may continue to have Braxton Hicks contractions until you go into true labor. How to tell the difference between true labor and false labor True labor  Contractions last 30-70 seconds.  Contractions become very regular.  Discomfort is usually felt in the top of the uterus, and it spreads to the lower abdomen and low back.  Contractions do not go away with walking.  Contractions usually become more intense and increase in frequency.  The cervix dilates and gets thinner. False labor  Contractions are usually shorter and not as strong as true labor contractions.  Contractions are usually irregular.  Contractions  are often felt in the front of the lower abdomen and in the groin.  Contractions may go away when you walk around or change positions while lying down.  Contractions get weaker and are shorter-lasting as time goes on.  The cervix usually does not dilate or become thin. Follow these instructions at home:   Take over-the-counter and prescription medicines only as told by your health care provider.  Keep up with your usual exercises and follow other instructions from your health care provider.  Eat and drink lightly if you think you are going into labor.  If Braxton Hicks contractions are making you uncomfortable: ? Change your position from lying down or resting to walking, or change from walking to resting. ? Sit and rest in a tub of warm water. ? Drink enough fluid to keep your urine pale yellow. Dehydration may cause these contractions. ? Do slow and deep breathing several times an hour.  Keep all follow-up prenatal visits as told by your health care provider. This is important. Contact a health care provider if:  You have a fever.  You have continuous pain in your abdomen. Get help right away if:  Your contractions become stronger, more regular, and closer together.  You have fluid leaking or gushing from your vagina.  You pass blood-tinged mucus (bloody show).  You have bleeding from your vagina.  You have low back pain that you never had before.  You feel your baby's head pushing down and causing pelvic pressure.  Your baby is not moving inside you as much as it used to. Summary  Contractions that occur before labor are   called Braxton Hicks contractions, false labor, or practice contractions.  Braxton Hicks contractions are usually shorter, weaker, farther apart, and less regular than true labor contractions. True labor contractions usually become progressively stronger and regular, and they become more frequent.  Manage discomfort from Braxton Hicks contractions  by changing position, resting in a warm bath, drinking plenty of water, or practicing deep breathing. This information is not intended to replace advice given to you by your health care provider. Make sure you discuss any questions you have with your health care provider. Document Released: 01/13/2017 Document Revised: 08/12/2017 Document Reviewed: 01/13/2017 Elsevier Patient Education  2020 Elsevier Inc.  

## 2019-07-18 NOTE — Addendum Note (Signed)
Addended by: Raliegh Ip on: 07/18/2019 03:12 PM   Modules accepted: Orders

## 2019-07-18 NOTE — Progress Notes (Signed)
ROB doing well. Feels good movement. Discussed GBS testing @ 36 wks. PT asking about traveling. Reviewed potential risks . She verbalizes understanding. States she is going to try to reschedule class. PTL precautions reviewed. Follow up 2 wk.   Philip Aspen, CNM

## 2019-07-30 ENCOUNTER — Ambulatory Visit (INDEPENDENT_AMBULATORY_CARE_PROVIDER_SITE_OTHER): Payer: BLUE CROSS/BLUE SHIELD | Admitting: Certified Nurse Midwife

## 2019-07-30 ENCOUNTER — Other Ambulatory Visit: Payer: Self-pay

## 2019-07-30 VITALS — BP 109/69 | HR 71 | Wt 134.0 lb

## 2019-07-30 DIAGNOSIS — Z3493 Encounter for supervision of normal pregnancy, unspecified, third trimester: Secondary | ICD-10-CM

## 2019-07-30 DIAGNOSIS — Z3A33 33 weeks gestation of pregnancy: Secondary | ICD-10-CM

## 2019-07-30 LAB — POCT URINALYSIS DIPSTICK OB
Bilirubin, UA: NEGATIVE
Blood, UA: NEGATIVE
Glucose, UA: NEGATIVE
Ketones, UA: NEGATIVE
Leukocytes, UA: NEGATIVE
Nitrite, UA: NEGATIVE
POC,PROTEIN,UA: NEGATIVE
Spec Grav, UA: 1.015 (ref 1.010–1.025)
Urobilinogen, UA: 0.2 E.U./dL
pH, UA: 6.5 (ref 5.0–8.0)

## 2019-07-30 NOTE — Progress Notes (Signed)
ROB-Doing well, no questions or concerns. Anticipatory guidance regarding course of prenatal care. Ready, Set, Baby Information sheet given. Reviewed red flag symptoms and when to call. RTC x 3 weeks for 36 wk cultures and ROB or sooner if needed.

## 2019-07-30 NOTE — Patient Instructions (Signed)
WHAT OB PATIENTS CAN EXPECT   Confirmation of pregnancy and ultrasound ordered if medically indicated-[redacted] weeks gestation  New OB (NOB) intake with nurse and New OB (NOB) labs- [redacted] weeks gestation  New OB (NOB) physical examination with provider- 11/[redacted] weeks gestation  Flu vaccine-[redacted] weeks gestation  Anatomy scan-[redacted] weeks gestation  Glucose tolerance test, blood work to test for anemia, T-dap vaccine-[redacted] weeks gestation  Vaginal swabs/cultures-STD/Group B strep-[redacted] weeks gestation  Appointments every 4 weeks until 28 weeks  Every 2 weeks from 28 weeks until 36 weeks  Weekly visits from 36 weeks until delivery  Group B Streptococcus Test During Pregnancy Why am I having this test? Routine testing, also called screening, for group B streptococcus (GBS) is recommended for all pregnant women between the 36th and 37th week of pregnancy. GBS is a type of bacteria that can be passed from mother to baby during childbirth. Screening will help guide whether or not you will need treatment during labor and delivery to prevent complications such as:  An infection in your uterus during labor.  An infection in your uterus after delivery.  A serious infection in your baby after delivery, such as pneumonia, meningitis, or sepsis. GBS screening is not often done before 36 weeks of pregnancy unless you go into labor prematurely. What happens if I have group B streptococcus? If testing shows that you have GBS, your health care provider will recommend treatment with IV antibiotics during labor and delivery. This treatment significantly decreases the risk of complications for you and your baby. If you have a planned C-section and you have GBS, you may not need to be treated with antibiotics because GBS is usually passed to babies after labor starts and your water breaks. If you are in labor or your water breaks before your C-section, it is possible for GBS to get into your uterus and be passed to your baby,  so you might need treatment. Is there a chance I may not need to be tested? You may not need to be tested for GBS if:  You have a urine test that shows GBS before 36 to 37 weeks.  You had a baby with GBS infection after a previous delivery. In these cases, you will automatically be treated for GBS during labor and delivery. What is being tested? This test is done to check if you have group B streptococcus in your vagina or rectum. What kind of sample is taken? To collect samples for this test, your health care provider will swab your vagina and rectum with a cotton swab. The sample is then sent to the lab to see if GBS is present. What happens during the test?   You will remove your clothing from the waist down.  You will lie down on an exam table in the same position as you would for a pelvic exam.  Your health care provider will swab your vagina and rectum to collect samples for a culture test.  You will be able to go home after the test and do all your usual activities. How are the results reported? The test results are reported as positive or negative. What do the results mean?  A positive test means you are at risk for passing GBS to your baby during labor and delivery. Your health care provider will recommend that you are treated with an IV antibiotic during labor and delivery.  A negative test means you are at very low risk of passing GBS to your baby. There is still  a low risk of passing GBS to your baby because sometimes test results may report that you do not have a condition when you do (false-negative result) or there is a chance that you may become infected with GBS after the test is done. You most likely will not need to be treated with an antibiotic during labor and delivery. Talk with your health care provider about what your results mean. Questions to ask your health care provider Ask your health care provider, or the department that is doing the test:  When will my  results be ready?  How will I get my results?  What are my treatment options? Summary  Routine testing (screening) for group B streptococcus (GBS) is recommended for all pregnant women between the 36th and 37th week of pregnancy.  GBS is a type of bacteria that can be passed from mother to baby during childbirth.  If testing shows that you have GBS, your health care provider will recommend that you are treated with IV antibiotics during labor and delivery. This treatment almost always prevents infection in newborns. This information is not intended to replace advice given to you by your health care provider. Make sure you discuss any questions you have with your health care provider. Document Released: 09/27/2018 Document Revised: 12/21/2018 Document Reviewed: 09/27/2018 Elsevier Patient Education  2020 [employer]. Fetal Movement Counts Patient Name: ________________________________________________ Patient Due Date: ____________________ What is a fetal movement count?  A fetal movement count is the number of times that you feel your baby move during a certain amount of time. This may also be called a fetal kick count. A fetal movement count is recommended for every pregnant woman. You may be asked to start counting fetal movements as early as week 28 of your pregnancy. Pay attention to when your baby is most active. You may notice your baby's sleep and wake cycles. You may also notice things that make your baby move more. You should do a fetal movement count:  When your baby is normally most active.  At the same time each day. A good time to count movements is while you are resting, after having something to eat and drink. How do I count fetal movements? 1. Find a quiet, comfortable area. Sit, or lie down on your side. 2. Write down the date, the start time and stop time, and the number of movements that you felt between those two times. Take this information with you to your health  care visits. 3. For 2 hours, count kicks, flutters, swishes, rolls, and jabs. You should feel at least 10 movements during 2 hours. 4. You may stop counting after you have felt 10 movements. 5. If you do not feel 10 movements in 2 hours, have something to eat and drink. Then, keep resting and counting for 1 hour. If you feel at least 4 movements during that hour, you may stop counting. Contact a health care provider if:  You feel fewer than 4 movements in 2 hours.  Your baby is not moving like he or she usually does. Date: ____________ Start time: ____________ Stop time: ____________ Movements: ____________ Date: ____________ Start time: ____________ Stop time: ____________ Movements: ____________ Date: ____________ Start time: ____________ Stop time: ____________ Movements: ____________ Date: ____________ Start time: ____________ Stop time: ____________ Movements: ____________ Date: ____________ Start time: ____________ Stop time: ____________ Movements: ____________ Date: ____________ Start time: ____________ Stop time: ____________ Movements: ____________ Date: ____________ Start time: ____________ Stop time: ____________ Movements: ____________ Date: ____________ Start time:  ____________ Stop time: ____________ Movements: ____________ Date: ____________ Start time: ____________ Stop time: ____________ Movements: ____________ This information is not intended to replace advice given to you by your health care provider. Make sure you discuss any questions you have with your health care provider. Document Released: 09/29/2006 Document Revised: 09/19/2018 Document Reviewed: 10/09/2015 Elsevier Patient Education  2020 [employer]

## 2019-07-30 NOTE — Progress Notes (Signed)
ROB-No complaints.  

## 2019-08-03 ENCOUNTER — Encounter: Payer: BLUE CROSS/BLUE SHIELD | Admitting: Obstetrics and Gynecology

## 2019-08-24 ENCOUNTER — Ambulatory Visit (INDEPENDENT_AMBULATORY_CARE_PROVIDER_SITE_OTHER): Payer: BLUE CROSS/BLUE SHIELD | Admitting: Certified Nurse Midwife

## 2019-08-24 ENCOUNTER — Encounter: Payer: Self-pay | Admitting: Certified Nurse Midwife

## 2019-08-24 ENCOUNTER — Other Ambulatory Visit: Payer: Self-pay

## 2019-08-24 VITALS — BP 112/65 | HR 77 | Wt 137.2 lb

## 2019-08-24 DIAGNOSIS — Z3403 Encounter for supervision of normal first pregnancy, third trimester: Secondary | ICD-10-CM

## 2019-08-24 NOTE — Progress Notes (Signed)
ROB doing well. Feels good movement. SVe 1/thick/-2. GBS and cultures today.Will follow up with results. Staffing changes discussed Follow up 1 wk.   Philip Aspen, CNM

## 2019-08-24 NOTE — Patient Instructions (Signed)
Braxton Hicks Contractions Contractions of the uterus can occur throughout pregnancy, but they are not always a sign that you are in labor. You may have practice contractions called Braxton Hicks contractions. These false labor contractions are sometimes confused with true labor. What are Braxton Hicks contractions? Braxton Hicks contractions are tightening movements that occur in the muscles of the uterus before labor. Unlike true labor contractions, these contractions do not result in opening (dilation) and thinning of the cervix. Toward the end of pregnancy (32-34 weeks), Braxton Hicks contractions can happen more often and may become stronger. These contractions are sometimes difficult to tell apart from true labor because they can be very uncomfortable. You should not feel embarrassed if you go to the hospital with false labor. Sometimes, the only way to tell if you are in true labor is for your health care provider to look for changes in the cervix. The health care provider will do a physical exam and may monitor your contractions. If you are not in true labor, the exam should show that your cervix is not dilating and your water has not broken. If there are no other health problems associated with your pregnancy, it is completely safe for you to be sent home with false labor. You may continue to have Braxton Hicks contractions until you go into true labor. How to tell the difference between true labor and false labor True labor  Contractions last 30-70 seconds.  Contractions become very regular.  Discomfort is usually felt in the top of the uterus, and it spreads to the lower abdomen and low back.  Contractions do not go away with walking.  Contractions usually become more intense and increase in frequency.  The cervix dilates and gets thinner. False labor  Contractions are usually shorter and not as strong as true labor contractions.  Contractions are usually irregular.  Contractions  are often felt in the front of the lower abdomen and in the groin.  Contractions may go away when you walk around or change positions while lying down.  Contractions get weaker and are shorter-lasting as time goes on.  The cervix usually does not dilate or become thin. Follow these instructions at home:   Take over-the-counter and prescription medicines only as told by your health care provider.  Keep up with your usual exercises and follow other instructions from your health care provider.  Eat and drink lightly if you think you are going into labor.  If Braxton Hicks contractions are making you uncomfortable: ? Change your position from lying down or resting to walking, or change from walking to resting. ? Sit and rest in a tub of warm water. ? Drink enough fluid to keep your urine pale yellow. Dehydration may cause these contractions. ? Do slow and deep breathing several times an hour.  Keep all follow-up prenatal visits as told by your health care provider. This is important. Contact a health care provider if:  You have a fever.  You have continuous pain in your abdomen. Get help right away if:  Your contractions become stronger, more regular, and closer together.  You have fluid leaking or gushing from your vagina.  You pass blood-tinged mucus (bloody show).  You have bleeding from your vagina.  You have low back pain that you never had before.  You feel your baby's head pushing down and causing pelvic pressure.  Your baby is not moving inside you as much as it used to. Summary  Contractions that occur before labor are   called Braxton Hicks contractions, false labor, or practice contractions.  Braxton Hicks contractions are usually shorter, weaker, farther apart, and less regular than true labor contractions. True labor contractions usually become progressively stronger and regular, and they become more frequent.  Manage discomfort from Braxton Hicks contractions  by changing position, resting in a warm bath, drinking plenty of water, or practicing deep breathing. This information is not intended to replace advice given to you by your health care provider. Make sure you discuss any questions you have with your health care provider. Document Released: 01/13/2017 Document Revised: 08/12/2017 Document Reviewed: 01/13/2017 Elsevier Patient Education  2020 Elsevier Inc.  

## 2019-08-24 NOTE — Addendum Note (Signed)
Addended by: Raliegh Ip on: 08/24/2019 04:11 PM   Modules accepted: Orders

## 2019-08-26 LAB — STREP GP B NAA: Strep Gp B NAA: NEGATIVE

## 2019-08-27 LAB — GC/CHLAMYDIA PROBE AMP
Chlamydia trachomatis, NAA: NEGATIVE
Neisseria Gonorrhoeae by PCR: NEGATIVE

## 2019-08-29 ENCOUNTER — Encounter: Payer: BLUE CROSS/BLUE SHIELD | Admitting: Certified Nurse Midwife

## 2019-08-31 ENCOUNTER — Encounter: Payer: BLUE CROSS/BLUE SHIELD | Admitting: Certified Nurse Midwife

## 2019-09-04 ENCOUNTER — Encounter: Payer: BLUE CROSS/BLUE SHIELD | Admitting: Certified Nurse Midwife

## 2019-09-11 ENCOUNTER — Other Ambulatory Visit: Payer: Self-pay

## 2019-09-11 ENCOUNTER — Observation Stay (HOSPITAL_BASED_OUTPATIENT_CLINIC_OR_DEPARTMENT_OTHER)
Admission: EM | Admit: 2019-09-11 | Discharge: 2019-09-11 | Disposition: A | Discharge status: Home / Self Care | Payer: BLUE CROSS/BLUE SHIELD | Source: Home / Self Care | Admitting: Obstetrics and Gynecology

## 2019-09-11 ENCOUNTER — Encounter: Payer: Self-pay | Admitting: Obstetrics and Gynecology

## 2019-09-11 DIAGNOSIS — R7309 Other abnormal glucose: Secondary | ICD-10-CM

## 2019-09-11 DIAGNOSIS — N898 Other specified noninflammatory disorders of vagina: Secondary | ICD-10-CM | POA: Insufficient documentation

## 2019-09-11 DIAGNOSIS — Z3A39 39 weeks gestation of pregnancy: Secondary | ICD-10-CM | POA: Diagnosis not present

## 2019-09-11 DIAGNOSIS — O99891 Other specified diseases and conditions complicating pregnancy: Secondary | ICD-10-CM | POA: Insufficient documentation

## 2019-09-11 DIAGNOSIS — O471 False labor at or after 37 completed weeks of gestation: Secondary | ICD-10-CM

## 2019-09-11 DIAGNOSIS — Z3490 Encounter for supervision of normal pregnancy, unspecified, unspecified trimester: Secondary | ICD-10-CM

## 2019-09-11 LAB — RUPTURE OF MEMBRANE (ROM)PLUS: Rom Plus: NEGATIVE

## 2019-09-11 NOTE — OB Triage Note (Signed)
Pt presents c/o LOF. Pt reports she went to the bathroom and had some more leaking on the floor. Pt states fluid was clear. Pt called the nurse afterhours line and they sent her to get checked out. Denies bleeding. Reports positive fetal movement. BP slightly elevated so I have her BPs cycling q15 mins. Denies any HA, or epigastric pain. No swelling noted. Lungs clear. +2 reflexes. No clonus.  Other vitals WNL. Will continue to monitor.

## 2019-09-12 ENCOUNTER — Encounter: Payer: Self-pay | Admitting: Obstetrics and Gynecology

## 2019-09-12 ENCOUNTER — Encounter: Payer: BLUE CROSS/BLUE SHIELD | Admitting: Certified Nurse Midwife

## 2019-09-12 ENCOUNTER — Inpatient Hospital Stay: Payer: BLUE CROSS/BLUE SHIELD | Admitting: Anesthesiology

## 2019-09-12 ENCOUNTER — Inpatient Hospital Stay
Admission: EM | Admit: 2019-09-12 | Discharge: 2019-09-14 | DRG: 807 | Disposition: A | Discharge status: Home / Self Care | Payer: BLUE CROSS/BLUE SHIELD | Attending: Certified Nurse Midwife | Admitting: Certified Nurse Midwife

## 2019-09-12 ENCOUNTER — Other Ambulatory Visit: Payer: Self-pay

## 2019-09-12 DIAGNOSIS — O480 Post-term pregnancy: Secondary | ICD-10-CM

## 2019-09-12 DIAGNOSIS — R7309 Other abnormal glucose: Secondary | ICD-10-CM

## 2019-09-12 DIAGNOSIS — O26893 Other specified pregnancy related conditions, third trimester: Secondary | ICD-10-CM | POA: Diagnosis present

## 2019-09-12 DIAGNOSIS — Z3A40 40 weeks gestation of pregnancy: Secondary | ICD-10-CM

## 2019-09-12 DIAGNOSIS — O99019 Anemia complicating pregnancy, unspecified trimester: Secondary | ICD-10-CM

## 2019-09-12 DIAGNOSIS — Z20822 Contact with and (suspected) exposure to covid-19: Secondary | ICD-10-CM | POA: Diagnosis present

## 2019-09-12 HISTORY — DX: Anemia, unspecified: D64.9

## 2019-09-12 LAB — RESPIRATORY PANEL BY RT PCR (FLU A&B, COVID)
Influenza A by PCR: NEGATIVE
Influenza B by PCR: NEGATIVE
SARS Coronavirus 2 by RT PCR: NEGATIVE

## 2019-09-12 LAB — CBC
HCT: 38.6 % (ref 36.0–46.0)
Hemoglobin: 12.2 g/dL (ref 12.0–15.0)
MCH: 26.2 pg (ref 26.0–34.0)
MCHC: 31.6 g/dL (ref 30.0–36.0)
MCV: 82.8 fL (ref 80.0–100.0)
Platelets: 250 10*3/uL (ref 150–400)
RBC: 4.66 MIL/uL (ref 3.87–5.11)
RDW: 16 % — ABNORMAL HIGH (ref 11.5–15.5)
WBC: 15.3 10*3/uL — ABNORMAL HIGH (ref 4.0–10.5)
nRBC: 0 % (ref 0.0–0.2)

## 2019-09-12 LAB — TYPE AND SCREEN
ABO/RH(D): A POS
Antibody Screen: NEGATIVE

## 2019-09-12 LAB — ABO/RH: ABO/RH(D): A POS

## 2019-09-12 MED ORDER — ONDANSETRON HCL 4 MG/2ML IJ SOLN: 4.0000 mg | Freq: Four times a day (QID) | INTRAMUSCULAR | Status: DC | PRN

## 2019-09-12 MED ORDER — OXYTOCIN 40 UNITS IN NORMAL SALINE INFUSION - SIMPLE MED
1.0000 m[IU]/min | INTRAVENOUS | Status: DC
  Administered 2019-09-12: 4 m[IU]/min via INTRAVENOUS
  Filled 2019-09-12: qty 1000

## 2019-09-12 MED ORDER — PHENYLEPHRINE 40 MCG/ML (10ML) SYRINGE FOR IV PUSH (FOR BLOOD PRESSURE SUPPORT)
80.0000 ug | PREFILLED_SYRINGE | INTRAVENOUS | Status: DC | PRN
  Filled 2019-09-12: qty 10

## 2019-09-12 MED ORDER — EPHEDRINE 5 MG/ML INJ
10.0000 mg | INTRAVENOUS | Status: DC | PRN
  Filled 2019-09-12: qty 2

## 2019-09-12 MED ORDER — TERBUTALINE SULFATE 1 MG/ML IJ SOLN: 0.2500 mg | Freq: Once | INTRAMUSCULAR | Status: DC | PRN

## 2019-09-12 MED ORDER — LIDOCAINE HCL (PF) 1 % IJ SOLN
INTRAMUSCULAR | Status: AC
  Filled 2019-09-12: qty 30

## 2019-09-12 MED ORDER — SOD CITRATE-CITRIC ACID 500-334 MG/5ML PO SOLN: 30.0000 mL | ORAL | Status: DC | PRN

## 2019-09-12 MED ORDER — LIDOCAINE HCL (PF) 1 % IJ SOLN
30.0000 mL | INTRAMUSCULAR | Status: AC | PRN
  Administered 2019-09-12: 30 mL via SUBCUTANEOUS

## 2019-09-12 MED ORDER — LACTATED RINGERS IV SOLN
500.0000 mL | INTRAVENOUS | Status: DC | PRN
  Administered 2019-09-12: 500 mL via INTRAVENOUS

## 2019-09-12 MED ORDER — FENTANYL 2.5 MCG/ML W/ROPIVACAINE 0.15% IN NS 100 ML EPIDURAL (ARMC)
EPIDURAL | Status: AC
  Filled 2019-09-12: qty 100

## 2019-09-12 MED ORDER — ACETAMINOPHEN 325 MG PO TABS: 650.0000 mg | ORAL_TABLET | ORAL | Status: DC | PRN

## 2019-09-12 MED ORDER — OXYTOCIN 40 UNITS IN NORMAL SALINE INFUSION - SIMPLE MED: 2.5000 [IU]/h | INTRAVENOUS | Status: DC

## 2019-09-12 MED ORDER — OXYTOCIN BOLUS FROM INFUSION
500.0000 mL | Freq: Once | INTRAVENOUS | Status: AC
  Administered 2019-09-12: 500 mL/h via INTRAVENOUS

## 2019-09-12 MED ORDER — LIDOCAINE HCL (PF) 1 % IJ SOLN
INTRAMUSCULAR | Status: DC | PRN
  Administered 2019-09-12 (×2): 3 mL via SUBCUTANEOUS

## 2019-09-12 MED ORDER — LACTATED RINGERS IV SOLN
INTRAVENOUS | Status: DC
  Administered 2019-09-12: 1000 mL via INTRAVENOUS

## 2019-09-12 MED ORDER — LIDOCAINE-EPINEPHRINE (PF) 1.5 %-1:200000 IJ SOLN
INTRAMUSCULAR | Status: DC | PRN
  Administered 2019-09-12: 3 mL via EPIDURAL

## 2019-09-12 MED ORDER — DIPHENHYDRAMINE HCL 50 MG/ML IJ SOLN: 12.5000 mg | INTRAMUSCULAR | Status: DC | PRN

## 2019-09-12 MED ORDER — LACTATED RINGERS IV SOLN: 500.0000 mL | Freq: Once | INTRAVENOUS | Status: DC

## 2019-09-12 MED ORDER — BUPIVACAINE HCL (PF) 0.25 % IJ SOLN
INTRAMUSCULAR | Status: DC | PRN
  Administered 2019-09-12: 3 mL via EPIDURAL
  Administered 2019-09-12: 4 mL via EPIDURAL

## 2019-09-12 MED ORDER — MISOPROSTOL 200 MCG PO TABS
ORAL_TABLET | ORAL | Status: AC
  Filled 2019-09-12: qty 4

## 2019-09-12 MED ORDER — BUTORPHANOL TARTRATE 1 MG/ML IJ SOLN: 1.0000 mg | INTRAMUSCULAR | Status: DC | PRN

## 2019-09-12 MED ORDER — AMMONIA AROMATIC IN INHA
RESPIRATORY_TRACT | Status: AC
  Filled 2019-09-12: qty 10

## 2019-09-12 MED ORDER — OXYTOCIN 10 UNIT/ML IJ SOLN
INTRAMUSCULAR | Status: AC
  Filled 2019-09-12: qty 2

## 2019-09-12 MED ORDER — FENTANYL 2.5 MCG/ML W/ROPIVACAINE 0.15% IN NS 100 ML EPIDURAL (ARMC)
12.0000 mL/h | EPIDURAL | Status: DC
  Administered 2019-09-12 (×2): 12 mL/h via EPIDURAL
  Filled 2019-09-12: qty 100

## 2019-09-12 NOTE — Final Progress Note (Signed)
L&D OB Triage Note  Phyllis Jones is a 28 y.o. G1P0 female at [redacted]w[redacted]d, EDD Estimated Date of Delivery: 09/12/19 who presented to triage for complaints of LOF and conntractions.  She was evaluated by the nurses with no significant findings for ruptured membranes or active labor. Initially elevated BP noted, however remaining vital signs stable after further monitoring. An NST was performed and has been reviewed by MD.     Physical Exam:  Vitals:   09/11/19 0908 09/11/19 0923 09/11/19 0938 09/11/19 0953  BP: (!) 119/92 125/88 118/88 119/81  Pulse: 96 88 89 95  Resp:      Temp:      TempSrc:      SpO2:      Weight:      Height:        Dilation: 1.5 Effacement (%): 60 Cervical Position: Posterior Station: -2 Presentation: Vertex Exam by:: Robin Searing RN  NST INTERPRETATION: Indications: rule out uterine contractions  Mode: External Baseline Rate (A): 145 bpm Variability: Moderate Accelerations: 15 x 15 Decelerations: None     Contraction Frequency (min): occasional  Impression: reactive   Plan: NST performed was reviewed and was found to be reactive. She was discharged home with labor precautions.  Continue routine prenatal care. Follow up with OB/GYN as previously scheduled.     Rubie Maid, MD [facility]

## 2019-09-12 NOTE — Anesthesia Procedure Notes (Signed)
Epidural Patient location during procedure: OB Start time: 09/12/2019 11:56 AM End time: 09/12/2019 12:20 PM  Staffing Anesthesiologist: Martha Clan, MD Resident/CRNA: Hedda Slade, CRNA Performed: resident/CRNA   Preanesthetic Checklist Completed: patient identified, IV checked, site marked, risks and benefits discussed, surgical consent, monitors and equipment checked, pre-op evaluation and timeout performed  Epidural Patient position: sitting Prep: ChloraPrep Patient monitoring: heart rate, continuous pulse ox and blood pressure Approach: midline Location: L3-L4 Injection technique: LOR air  Needle:  Needle type: Tuohy  Needle gauge: 17 G Needle length: 9 cm and 9 Needle insertion depth: 6 cm Catheter type: closed end flexible Catheter size: 19 Gauge Catheter at skin depth: 11 cm Test dose: negative and 1.5% lidocaine with Epi 1:200 K  Assessment Events: blood not aspirated, injection not painful, no injection resistance, no paresthesia and negative IV test  Additional Notes 2 attempt  First attempt by Chancy Hurter, unable to enter epidural space, continually meeting os.    Pt. Evaluated and documentation done after procedure finished. Patient identified. Risks/Benefits/Options discussed with patient including but not limited to bleeding, infection, nerve damage, paralysis, failed block, incomplete pain control, headache, blood pressure changes, nausea, vomiting, reactions to medication both or allergic, itching and postpartum back pain. Confirmed with bedside nurse the patient's most recent platelet count. Confirmed with patient that they are not currently taking any anticoagulation, have any bleeding history or any family history of bleeding disorders. Patient expressed understanding and wished to proceed. All questions were answered. Sterile technique was used throughout the entire procedure. Please see nursing notes for vital signs. Test dose was given through  epidural catheter and negative prior to continuing to dose epidural or start infusion. Warning signs of high block given to the patient including shortness of breath, tingling/numbness in hands, complete motor block, or any concerning symptoms with instructions to call for help. Patient was given instructions on fall risk and not to get out of bed. All questions and concerns addressed with instructions to call with any issues or inadequate analgesia.   Patient tolerated the insertion well without immediate complications.Reason for block:procedure for pain

## 2019-09-12 NOTE — H&P (Signed)
History and Physical   HPI  Phyllis Jones is a 28 y.o. G1P0 at [redacted]w[redacted]d Estimated Date of Delivery: 09/12/19 who is being admitted for labor management.   OB History  OB History  Gravida Para Term Preterm AB Living  1 0 0 0 0 0  SAB TAB Ectopic Multiple Live Births  0 0 0 0 0    # Outcome Date GA Lbr Len/2nd Weight Sex Delivery Anes PTL Lv  1 Current             PROBLEM LIST  Pregnancy complications or risks: Patient Active Problem List   Diagnosis Date Noted  . Labor and delivery, indication for care 09/12/2019  . Pregnancy 09/11/2019  . Anemia in pregnancy 06/22/2019  . Elevated glucose level 06/22/2019    Prenatal labs and studies: ABO, Rh: --/--/A POS (12/30 1054) Antibody: NEG (12/30 1054) Rubella: 9.95 (06/11 1112) RPR: Non Reactive (10/06 0922)  HBsAg: Negative (05/21 1038)  HIV: Non Reactive (05/21 1038)  GBS:--/Negative (12/11 1625)   Past Medical History:  Diagnosis Date  . Anemia      Past Surgical History:  Procedure Laterality Date  . TONSILLECTOMY  2004     Medications    Current Discharge Medication List    CONTINUE these medications which have NOT CHANGED   Details  ferrous sulfate 325 (65 FE) MG EC tablet Take 325 mg by mouth daily.    Prenatal Vit-Fe Fumarate-FA (MULTIVITAMIN-PRENATAL) 27-0.8 MG TABS tablet Take 1 tablet by mouth daily at 12 noon.         Allergies  Patient has no known allergies.  Review of Systems  Constitutional: negative Eyes: negative Ears, nose, mouth, throat, and face: negative Respiratory: negative Cardiovascular: negative Gastrointestinal: negative Genitourinary:negative Integument/breast: negative Hematologic/lymphatic: negative Musculoskeletal:negative Neurological: negative Behavioral/Psych: negative Endocrine: negative Allergic/Immunologic: negative  Physical Exam  BP 130/88 (BP Location: Right Arm)   Pulse 97   Temp 97.7 F (36.5 C) (Oral)   Resp 16   Ht 5\' 3"  (1.6 m)    Wt 62.6 kg   LMP 12/06/2018   BMI 24.45 kg/m   Lungs:  CTA B Cardio: RRR  Abd: Soft, gravid, NT Presentation: cephalic EXT: No C/C/ 1+ Edema DTRs: 2+ B CERVIX: Dilation: 2 Effacement (%): 90 Cervical Position: Middle Station: -1 Presentation: Vertex Exam by:: L Braddy RN   See Prenatal records for more detailed PE.     FHR:  Baseline: 140 bpm, Variability: Good {> 6 bpm), Accelerations: Reactive and Decelerations: Absent  Toco: Uterine Contractions: Frequency: Every 2-3 minutes, Duration: 50-80 seconds and Intensity: moderate    Test Results  Results for orders placed or performed during the hospital encounter of 09/12/19 (from the past 24 hour(s))  Respiratory Panel by RT PCR (Flu A&B, Covid) - Nasopharyngeal Swab     Status: None   Collection Time: 09/12/19  9:07 AM   Specimen: Nasopharyngeal Swab  Result Value Ref Range   SARS Coronavirus 2 by RT PCR NEGATIVE NEGATIVE   Influenza A by PCR NEGATIVE NEGATIVE   Influenza B by PCR NEGATIVE NEGATIVE  Type and screen [facility]     Status: None   Collection Time: 09/12/19 10:54 AM  Result Value Ref Range   ABO/RH(D) A POS    Antibody Screen NEG    Sample Expiration      09/15/2019,2359 Performed at [facility], [address], [place], [place]   CBC     Status:  Abnormal   Collection Time: 09/12/19 10:54 AM  Result Value Ref Range   WBC 15.3 (H) 4.0 - 10.5 K/uL   RBC 4.66 3.87 - 5.11 MIL/uL   Hemoglobin 12.2 12.0 - 15.0 g/dL   HCT 38.6 36.0 - 46.0 %   MCV 82.8 80.0 - 100.0 fL   MCH 26.2 26.0 - 34.0 pg   MCHC 31.6 30.0 - 36.0 g/dL   RDW 16.0 (H) 11.5 - 15.5 %   Platelets 250 150 - 400 K/uL   nRBC 0.0 0.0 - 0.2 %   Group B Strep negative  Assessment   G1P0 at [redacted]w[redacted]d Estimated Date of Delivery: 09/12/19  The fetus is reassuring.    Patient Active Problem List   Diagnosis Date Noted  . Labor and delivery, indication for care 09/12/2019  . Pregnancy 09/11/2019   . Anemia in pregnancy 06/22/2019  . Elevated glucose level 06/22/2019    Plan  1. Admit to L&D :   continue present management 2. EFM:-- Category 1 3. Epidural if desired.  Stadol for IV pain until epidural requested. 4. Admission labs  5. Philip Aspen, CNM  Philip Aspen, [place]  09/12/2019 12:07 PM

## 2019-09-12 NOTE — OB Triage Note (Signed)
Pt c/o ctx that started this am and haven gotten stronger and closer together.

## 2019-09-12 NOTE — OB Triage Note (Signed)
Pt . Presented to L/D for contractions that began this am and have begun to increase in intensity and frequency and are unrelieved by rest. She rates the intermittent pain 5/10. She has noticed a pink-tinged discharge with no LOF. Positive fetal movement. No pain with urination. Last intercourse and last cervical exam 10/29. VSS. Will continue to monitor.

## 2019-09-12 NOTE — Progress Notes (Signed)
LABOR NOTE   Phyllis Jones 28 y.o.@ at [redacted]w[redacted]d Active phase labor.  SUBJECTIVE:  Comfortable with epidural, denies pressure. OBJECTIVE:  BP 130/88 (BP Location: Right Arm)   Pulse 97   Temp 97.7 F (36.5 C) (Oral)   Resp 16   Ht 5\' 3"  (1.6 m)   Wt 62.6 kg   LMP 12/06/2018   SpO2 99%   BMI 24.45 kg/m  No intake/output data recorded.  She has shown cervical change. CERVIX: 8cm:  90%:   -1:   mid position:   soft SVE:   Dilation: 6.5 Effacement (%): 90 Station: -1 Exam by:: Philip Aspen, CNM CONTRACTIONS: regular, every 2 minutes FHR: Fetal heart tracing reviewed. Baseline: 140 bpm, Variability: Good {> 6 bpm), Accelerations: Reactive and Decelerations: variable Category II   Analgesia: Epidural  Labs: Lab Results  Component Value Date   WBC 15.3 (H) 09/12/2019   HGB 12.2 09/12/2019   HCT 38.6 09/12/2019   MCV 82.8 09/12/2019   PLT 250 09/12/2019    ASSESSMENT: 1) Labor curve reviewed.       Progress: Active phase labor.     Membranes: ruptured, clear fluid          Active Problems:   Labor and delivery, indication for care   PLAN: continue present management   Philip Aspen, CNM 09/12/2019 7:16 PM

## 2019-09-12 NOTE — Anesthesia Preprocedure Evaluation (Signed)
Anesthesia Evaluation  Patient identified by MRN, date of birth, ID band Patient awake    Reviewed: Allergy & Precautions, H&P , NPO status , Patient's Chart, lab work & pertinent test results  Airway Mallampati: III  TM Distance: >3 FB Neck ROM: full    Dental  (+) Teeth Intact   Pulmonary           Cardiovascular      Neuro/Psych    GI/Hepatic   Endo/Other    Renal/GU      Musculoskeletal   Abdominal   Peds  Hematology  (+) Blood dyscrasia, anemia ,   Anesthesia Other Findings   Reproductive/Obstetrics (+) Pregnancy                             Anesthesia Physical Anesthesia Plan  ASA: II  Anesthesia Plan: Epidural   Post-op Pain Management:    Induction:   PONV Risk Score and Plan:   Airway Management Planned:   Additional Equipment:   Intra-op Plan:   Post-operative Plan:   Informed Consent: I have reviewed the patients History and Physical, chart, labs and discussed the procedure including the risks, benefits and alternatives for the proposed anesthesia with the patient or authorized representative who has indicated his/her understanding and acceptance.     Dental Advisory Given  Plan Discussed with: Anesthesiologist and CRNA  Anesthesia Plan Comments:         Anesthesia Quick Evaluation

## 2019-09-12 NOTE — Progress Notes (Signed)
LABOR NOTE   Phyllis Jones 28 y.o.@ at [redacted]w[redacted]d Active phase labor.  SUBJECTIVE:  Comfortable with epidural  OBJECTIVE:  BP 130/88 (BP Location: Right Arm)   Pulse 97   Temp 97.7 F (36.5 C) (Oral)   Resp 16   Ht 5\' 3"  (1.6 m)   Wt 62.6 kg   LMP 12/06/2018   SpO2 99%   BMI 24.45 kg/m  No intake/output data recorded.  She has shown cervical change. CERVIX: 6-7:  90:   -1:   mid position:   soft SVE:   Dilation: 6.5 Effacement (%): 90 Station: -1 Exam by:: Philip Aspen, CNM CONTRACTIONS: regular, every 2-4 minutes FHR: Fetal heart tracing reviewed. Baseline: 130 bpm, Variability: Good {> 6 bpm), Accelerations: Reactive and Decelerations: variable  Category II   Analgesia: Epidural  Labs: Lab Results  Component Value Date   WBC 15.3 (H) 09/12/2019   HGB 12.2 09/12/2019   HCT 38.6 09/12/2019   MCV 82.8 09/12/2019   PLT 250 09/12/2019    ASSESSMENT: 1) Labor curve reviewed.       Progress: Active phase labor.     Membranes: ruptured, clear fluid           Active Problems:   Labor and delivery, indication for care   PLAN: continue present management  Philip Aspen, CNM  09/12/2019 4:42 PM

## 2019-09-13 ENCOUNTER — Encounter: Payer: Self-pay | Admitting: Obstetrics and Gynecology

## 2019-09-13 LAB — CBC
HCT: 29.7 % — ABNORMAL LOW (ref 36.0–46.0)
Hemoglobin: 9.2 g/dL — ABNORMAL LOW (ref 12.0–15.0)
MCH: 26 pg (ref 26.0–34.0)
MCHC: 31 g/dL (ref 30.0–36.0)
MCV: 83.9 fL (ref 80.0–100.0)
Platelets: 226 10*3/uL (ref 150–400)
RBC: 3.54 MIL/uL — ABNORMAL LOW (ref 3.87–5.11)
RDW: 15.9 % — ABNORMAL HIGH (ref 11.5–15.5)
WBC: 20.9 10*3/uL — ABNORMAL HIGH (ref 4.0–10.5)
nRBC: 0 % (ref 0.0–0.2)

## 2019-09-13 LAB — RPR: RPR Ser Ql: NONREACTIVE

## 2019-09-13 MED ORDER — FERROUS SULFATE 325 (65 FE) MG PO TABS
325.0000 mg | ORAL_TABLET | Freq: Every day | ORAL | Status: DC
  Administered 2019-09-13 – 2019-09-14 (×2): 325 mg via ORAL
  Filled 2019-09-13 (×2): qty 1

## 2019-09-13 MED ORDER — SIMETHICONE 80 MG PO CHEW: 80.0000 mg | CHEWABLE_TABLET | ORAL | Status: DC | PRN

## 2019-09-13 MED ORDER — BENZOCAINE-MENTHOL 20-0.5 % EX AERO
1.0000 "application " | INHALATION_SPRAY | CUTANEOUS | Status: DC | PRN
  Administered 2019-09-13 – 2019-09-14 (×2): 1 via TOPICAL
  Filled 2019-09-13 (×2): qty 56

## 2019-09-13 MED ORDER — ONDANSETRON HCL 4 MG/2ML IJ SOLN: 4.0000 mg | INTRAMUSCULAR | Status: DC | PRN

## 2019-09-13 MED ORDER — METHYLERGONOVINE MALEATE 0.2 MG/ML IJ SOLN: 0.2000 mg | INTRAMUSCULAR | Status: DC | PRN

## 2019-09-13 MED ORDER — ACETAMINOPHEN 325 MG PO TABS: 650.0000 mg | ORAL_TABLET | ORAL | Status: DC | PRN

## 2019-09-13 MED ORDER — OXYTOCIN 40 UNITS IN NORMAL SALINE INFUSION - SIMPLE MED
INTRAVENOUS | Status: AC
  Filled 2019-09-13: qty 1000

## 2019-09-13 MED ORDER — OXYCODONE-ACETAMINOPHEN 5-325 MG PO TABS: 1.0000 | ORAL_TABLET | ORAL | Status: DC | PRN

## 2019-09-13 MED ORDER — METHYLERGONOVINE MALEATE 0.2 MG PO TABS: 0.2000 mg | ORAL_TABLET | ORAL | Status: DC | PRN

## 2019-09-13 MED ORDER — PRENATAL MULTIVITAMIN CH
1.0000 | ORAL_TABLET | Freq: Every day | ORAL | Status: DC
  Administered 2019-09-13 – 2019-09-14 (×2): 1 via ORAL
  Filled 2019-09-13 (×2): qty 1

## 2019-09-13 MED ORDER — ONDANSETRON HCL 4 MG PO TABS: 4.0000 mg | ORAL_TABLET | ORAL | Status: DC | PRN

## 2019-09-13 MED ORDER — DOCUSATE SODIUM 100 MG PO CAPS
100.0000 mg | ORAL_CAPSULE | Freq: Two times a day (BID) | ORAL | Status: DC
  Administered 2019-09-13: 100 mg via ORAL
  Filled 2019-09-13: qty 1

## 2019-09-13 MED ORDER — IBUPROFEN 600 MG PO TABS
ORAL_TABLET | ORAL | Status: AC
  Administered 2019-09-13: 600 mg
  Filled 2019-09-13: qty 1

## 2019-09-13 MED ORDER — WITCH HAZEL-GLYCERIN EX PADS
1.0000 "application " | MEDICATED_PAD | CUTANEOUS | Status: DC | PRN
  Administered 2019-09-14: 1 via TOPICAL
  Filled 2019-09-13: qty 100

## 2019-09-13 MED ORDER — SENNOSIDES-DOCUSATE SODIUM 8.6-50 MG PO TABS: 2.0000 | ORAL_TABLET | ORAL | Status: DC

## 2019-09-13 MED ORDER — SENNOSIDES-DOCUSATE SODIUM 8.6-50 MG PO TABS
2.0000 | ORAL_TABLET | ORAL | Status: DC
  Administered 2019-09-13: 2 via ORAL
  Filled 2019-09-13 (×2): qty 2

## 2019-09-13 MED ORDER — DOCUSATE SODIUM 100 MG PO CAPS: 100.0000 mg | ORAL_CAPSULE | Freq: Two times a day (BID) | ORAL | Status: DC

## 2019-09-13 MED ORDER — DIBUCAINE (PERIANAL) 1 % EX OINT: 1.0000 "application " | TOPICAL_OINTMENT | CUTANEOUS | Status: DC | PRN

## 2019-09-13 MED ORDER — COCONUT OIL OIL
1.0000 "application " | TOPICAL_OIL | Status: DC | PRN
  Administered 2019-09-13: 1 via TOPICAL
  Filled 2019-09-13: qty 120

## 2019-09-13 MED ORDER — IBUPROFEN 600 MG PO TABS
600.0000 mg | ORAL_TABLET | Freq: Four times a day (QID) | ORAL | Status: DC
  Administered 2019-09-13 – 2019-09-14 (×6): 600 mg via ORAL
  Filled 2019-09-13 (×5): qty 1

## 2019-09-13 MED ORDER — IBUPROFEN 600 MG PO TABS: 600.0000 mg | ORAL_TABLET | Freq: Four times a day (QID) | ORAL | Status: DC

## 2019-09-13 MED ORDER — OXYCODONE-ACETAMINOPHEN 5-325 MG PO TABS: 2.0000 | ORAL_TABLET | ORAL | Status: DC | PRN

## 2019-09-13 NOTE — Anesthesia Postprocedure Evaluation (Signed)
Anesthesia Post Note  Patient: Phyllis Jones  Procedure(s) Performed: AN AD [place]  Patient location during evaluation: Mother Baby Anesthesia Type: Epidural Level of consciousness: awake and alert Pain management: pain level controlled Vital Signs Assessment: post-procedure vital signs reviewed and stable Respiratory status: spontaneous breathing, nonlabored ventilation and respiratory function stable Cardiovascular status: stable Postop Assessment: no headache, no backache and epidural receding Anesthetic complications: no     Last Vitals:  Vitals:   09/13/19 0155 09/13/19 0259  BP: 123/86 129/84  Pulse: 85 86  Resp: 20 20  Temp: 36.8 C 36.8 C  SpO2:  100%    Last Pain:  Vitals:   09/13/19 0259  TempSrc: Oral  PainSc:                  Jerrye Noble

## 2019-09-13 NOTE — Progress Notes (Signed)
Progress Note - Vaginal Delivery  Phyllis Jones is a 28 y.o. G1P1001 now PP day 1 s/p Vaginal, Spontaneous .   Subjective:  The patient reports no complaints, up ad lib, voiding and tolerating PO  Objective:  Vital signs in last 24 hours: Temp:  [97.7 F (36.5 C)-99.4 F (37.4 C)] 98.2 F (36.8 C) (12/31 0259) Pulse Rate:  [82-107] 86 (12/31 0259) Resp:  [12-20] 20 (12/31 0259) BP: (93-131)/(56-94) 129/84 (12/31 0259) SpO2:  [99 %-100 %] 100 % (12/31 0259) Weight:  [62.6 kg] 62.6 kg (12/30 0848)  Physical Exam:  General: alert, cooperative, appears stated age, fatigued and no distress Lochia: appropriate Uterine Fundus: firm@U  DVT Evaluation: No evidence of DVT seen on physical exam. No cords or calf tenderness. No significant calf/ankle edema.    Data Review Recent Labs    09/12/19 1054 09/13/19 0533  HGB 12.2 9.2*  HCT 38.6 29.7*    Assessment/Plan: Active Problems:   Labor and delivery, indication for care   Plan for discharge tomorrow   -- Continue routine PP care.     Philip Aspen, CNM  09/13/2019 7:48 AM

## 2019-09-14 DIAGNOSIS — Z20822 Contact with and (suspected) exposure to covid-19: Secondary | ICD-10-CM | POA: Diagnosis not present

## 2019-09-14 DIAGNOSIS — O26893 Other specified pregnancy related conditions, third trimester: Secondary | ICD-10-CM | POA: Diagnosis present

## 2019-09-14 DIAGNOSIS — Z3A40 40 weeks gestation of pregnancy: Secondary | ICD-10-CM | POA: Diagnosis not present

## 2019-09-14 MED ORDER — DOCUSATE SODIUM 100 MG PO CAPS: 100.0000 mg | ORAL_CAPSULE | Freq: Two times a day (BID) | ORAL | 0 refills | Status: DC

## 2019-09-14 MED ORDER — IBUPROFEN 600 MG PO TABS: 600.0000 mg | ORAL_TABLET | Freq: Four times a day (QID) | ORAL | 0 refills | Status: DC

## 2019-09-14 NOTE — Lactation Note (Deleted)
This note was copied from a baby's chart. Lactation Consultation Note  Patient Name: Phyllis Jones [id number] Date: 09/14/2019 Reason for consult: Follow-up assessment;[facility]ap[facility] Mom and baby for d/c, breastfed other children x 1 yr, states this baby is breastfeeding well and she has no questions, LC name and phone no written on white board to call for any questions or concerns  Maternal Data Formula Feeding for Exclusion: No Has patient been taught Hand Expression?: Yes Does the patient have breastfeeding experience prior to this delivery?: No  Feeding    LATCH Score Latch: Too sleepy or reluctant, no latch achieved, no sucking elicited.  Audible Swallowing: None  Type of Nipple: Everted at rest and after stimulation  Comfort (Breast/Nipple): Soft / non-tender  Hold (Positioning): Full assist, staff holds infant at breast  LATCH Score: 4  Interventions Interventions: Breast feeding basics reviewed;Assisted with latch;Skin to skin;Breast massage;Hand express;Pre-pump if needed;Adjust position;Support pillows;Position options  Lactation Tools Discussed/Used WIC Program: No   Consult Status Consult Status: Follow-up Date: 09/17/19 Follow-up type: Physician(ped appt)    Dyann Kief 09/14/2019, 2:30 PM

## 2019-09-14 NOTE — Lactation Note (Deleted)
This note was copied from a baby's chart. Lactation Consultation Note  Patient Name: Phyllis Jones [id number] Date: 09/14/2019 Reason for consult: Follow-up assessment;Primapara   Maternal Data Formula Feeding for Exclusion: No Has patient been taught Hand Expression?: Yes Does the patient have breastfeeding experience prior to this delivery?: No Mom calm with baby fussy at breast, attempted in sidelying football and cradle, mom and baby being d/c'd to home this am Feeding  Baby fussy at breast then falls asleep, will not latch despite multiple attempts, noted tongue tie, probable 6 or borderline on [place] tongue tie assessment tool, mom  States she has had good latches with tugging a few times since admission, baby can latch and suck on gloved finger,  discussed pumping if poor feed or latch, consult at [facility] as needed, questions answered   LATCH Score Latch: Too sleepy or reluctant, no latch achieved, no sucking elicited.  Audible Swallowing: None  Type of Nipple: Everted at rest and after stimulation  Comfort (Breast/Nipple): Soft / non-tender  Hold (Positioning): Full assist, staff holds infant at breast  LATCH Score: 4  Interventions Interventions: Breast feeding basics reviewed;Assisted with latch;Skin to skin;Breast massage;Hand express;Pre-pump if needed;Adjust position;Support pillows;Position options  Lactation Tools Discussed/Used WIC Program: No   Consult Status Consult Status: Follow-up Date: 09/17/19 Follow-up type: Physician(ped appt)    Phyllis Jones 09/14/2019, 1:13 PM

## 2019-09-14 NOTE — Progress Notes (Signed)
Discharge order received from doctor. Reviewed discharge instructions and prescriptions with patient and answered all questions. Follow up appointment instructions given. Patient verbalized understanding. ID bands checked. Patient discharged home with infant via wheelchair by nursing/auxillary.    Serai Tukes Garner, RN  

## 2019-09-14 NOTE — Discharge Instructions (Signed)

## 2019-09-14 NOTE — Final Progress Note (Signed)
Discharge Day SOAP Note:  Progress Note - Vaginal Delivery  Phyllis Jones is a 29 y.o. G1P1001 now PP day 2 s/p Vaginal, Spontaneous . Delivery was uncomplicated  Subjective  The patient has the following complaints: has no unusual complaints  Pain is controlled with current medications.   Patient is urinating without difficulty.  She is ambulating well.     Objective  Vital signs: BP 105/65 (BP Location: Left Arm)   Pulse 79   Temp 97.9 F (36.6 C) (Oral)   Resp 18   Ht 5\' 3"  (1.6 m)   Wt 62.6 kg   LMP 12/06/2018   SpO2 98%   Breastfeeding Unknown   BMI 24.45 kg/m   Physical Exam: Gen: NAD Fundus Fundal Tone: Firm  Lochia Amount: Small  Perineum Appearance: Edematous, Approximated     Data Review Labs: CBC Latest Ref Rng & Units 09/13/2019 09/12/2019 06/19/2019  WBC 4.0 - 10.5 K/uL 20.9(H) 15.3(H) 6.6  Hemoglobin 12.0 - 15.0 g/dL 08/19/2019) 7.6(O 10.2(L)  Hematocrit 36.0 - 46.0 % 29.7(L) 38.6 29.6(L)  Platelets 150 - 400 K/uL 226 250 228   A POS Performed at [facility], [address] [place], [place]   Assessment/Plan  Active Problems:   Labor and delivery, indication for care    Plan for discharge today.   Discharge Instructions: Per After Visit Summary. Activity: Advance as tolerated. Pelvic rest for 6 weeks.  Also refer to After Visit Summary Diet: Regular Medications: Allergies as of 09/14/2019   No Known Allergies     Medication List    TAKE these medications   docusate sodium 100 MG capsule Commonly known as: COLACE Take 1 capsule (100 mg total) by mouth 2 (two) times daily.   ferrous sulfate 325 (65 FE) MG EC tablet Take 325 mg by mouth daily.   ibuprofen 600 MG tablet Commonly known as: ADVIL Take 1 tablet (600 mg total) by mouth every 6 (six) hours.   multivitamin-prenatal 27-0.8 MG Tabs tablet Take 1 tablet by mouth daily at 12 noon.      Outpatient follow up: 6 wks postpartum with 11/12/2019, CNM , please call to scheduled.  Postpartum contraception: natural family planning  Discharged Condition: good  Discharged to: home  Newborn Data: Disposition:home with mother  Apgars: APGAR (1 MIN):   APGAR (5 MINS):   APGAR (10 MINS):    Baby Feeding: Breast    Doreene Burke, CNM  09/14/2019 10:03 AM

## 2019-09-14 NOTE — Discharge Summary (Signed)
                              Discharge Summary  Date of Admission: 09/12/2019  Date of Discharge: 09/14/2019  Admitting Diagnosis: Onset of Labor at [redacted]w[redacted]d  Mode of Delivery: normal spontaneous vaginal delivery                 Discharge Diagnosis: No other diagnosis   Intrapartum Procedures: epidural   Post partum procedures: none  Complications: 2 degree perineal laceration                      Discharge Day SOAP Note:  Progress Note - Vaginal Delivery  Phyllis Jones is a 29 y.o. G1P1001 now PP day 2 s/p Vaginal, Spontaneous . Delivery was uncomplicated  Subjective  The patient has the following complaints: has no unusual complaints  Pain is controlled with current medications.   Patient is urinating without difficulty.  She is ambulating well.     Objective  Vital signs: BP 105/65 (BP Location: Left Arm)   Pulse 79   Temp 97.9 F (36.6 C) (Oral)   Resp 18   Ht 5\' 3"  (1.6 m)   Wt 62.6 kg   LMP 12/06/2018   SpO2 98%   Breastfeeding Unknown   BMI 24.45 kg/m   Physical Exam: Gen: NAD Fundus Fundal Tone: Firm  Lochia Amount: Small  Perineum Appearance: Edematous, Approximated     Data Review Labs: CBC Latest Ref Rng & Units 09/13/2019 09/12/2019 06/19/2019  WBC 4.0 - 10.5 K/uL 20.9(H) 15.3(H) 6.6  Hemoglobin 12.0 - 15.0 g/dL 08/19/2019) 6.5(K 10.2(L)  Hematocrit 36.0 - 46.0 % 29.7(L) 38.6 29.6(L)  Platelets 150 - 400 K/uL 226 250 228   A POS Performed at [facility], [address] [place], [place]   Assessment/Plan  Active Problems:   Labor and delivery, indication for care    Plan for discharge today.   Discharge Instructions: Per After Visit Summary. Activity: Advance as tolerated. Pelvic rest for 6 weeks.  Also refer to After Visit Summary Diet: Regular Medications: Allergies as of 09/14/2019   No Known Allergies     Medication List    TAKE these medications   docusate sodium 100 MG capsule Commonly known as:  COLACE Take 1 capsule (100 mg total) by mouth 2 (two) times daily.   ferrous sulfate 325 (65 FE) MG EC tablet Take 325 mg by mouth daily.   ibuprofen 600 MG tablet Commonly known as: ADVIL Take 1 tablet (600 mg total) by mouth every 6 (six) hours.   multivitamin-prenatal 27-0.8 MG Tabs tablet Take 1 tablet by mouth daily at 12 noon.      Outpatient follow up: 6 wks postpartum with 11/12/2019, CNM , please call to scheduled.  Postpartum contraception: natural family planning  Discharged Condition: good  Discharged to: home  Newborn Data: Disposition:home with mother  Apgars: APGAR (1 MIN):   APGAR (5 MINS):   APGAR (10 MINS):    Baby Feeding: Breast    Doreene Burke, CNM  09/14/2019 10:03 AM

## 2019-09-14 NOTE — Lactation Note (Addendum)
This note was copied from a baby's chart. Lactation Consultation Note  Patient Name: Girl Lalena Salas Today's Date: 09/14/2019     Maternal Data   Mom calm with baby fussy at breast, attempted in sidelying football and cradle, mom and baby being d/c'd to home this am Feeding Feeding Type: Breast FedBaby fussy at breast then falls asleep, will not latch despite multiple attempts, noted tongue tie, probable 6 or borderline on [place] tongue tie assessment tool, mom  States she has had good latches with tugging a few times since admission, baby can latch and suck on gloved finger,  discussed pumping if poor feed or latch, consult at [facility] as needed, questions answered   LATCH Score                   Interventions    Lactation Tools Discussed/Used  DEBP as needed to stimulate breast and supplement as needed   Consult Status  Has Ped appt MOnday, 09-17-2019    Dyann Kief 09/14/2019, 2:36 PM

## 2019-09-19 ENCOUNTER — Telehealth: Payer: Self-pay | Admitting: Certified Nurse Midwife

## 2019-09-19 ENCOUNTER — Telehealth: Payer: Self-pay

## 2019-09-19 NOTE — Telephone Encounter (Signed)
Pt is having some bleeding and she is requesting a call back from a nurse. Please advise

## 2019-09-19 NOTE — Telephone Encounter (Addendum)
Pt has some question she is requesting a call from the nurse its about her BP .  She has checked it and its high also her husband got on the phone and was saying he wanted [place] to call them back today. I told him I sent it back for high  Priority. Please advise

## 2019-10-29 ENCOUNTER — Other Ambulatory Visit: Payer: Self-pay

## 2019-10-29 ENCOUNTER — Encounter: Payer: Self-pay | Admitting: Certified Nurse Midwife

## 2019-10-29 ENCOUNTER — Ambulatory Visit (INDEPENDENT_AMBULATORY_CARE_PROVIDER_SITE_OTHER): Payer: BLUE CROSS/BLUE SHIELD | Admitting: Certified Nurse Midwife

## 2019-10-29 NOTE — Progress Notes (Signed)
Subjective:    Phyllis Jones is a 29 y.o. G40P1001 Caucasian female who presents for a postpartum visit. She is 6 weeks postpartum following a spontaneous vaginal delivery at 40 gestational weeks. Anesthesia: epidural. I have fully reviewed the prenatal and intrapartum course. Postpartum course has been WNL. Baby's course has been WNL. Baby is feeding by breast. Bleeding no bleeding. Bowel function is normal. Bladder function is normal. Patient is not sexually active.. Contraception method is Nexplanon natural family planning. Postpartum depression screening: negative. Score 0.  Last pap 2019 and was normal.  The following portions of the patient's history were reviewed and updated as appropriate: allergies, current medications, past medical history, past surgical history and problem list.  Review of Systems Pertinent items are noted in HPI.   Vitals:   10/29/19 1034  BP: (!) 123/93  Pulse: 68  Weight: 118 lb 5 oz (53.7 kg)  Height: 5\' 3"  (1.6 m)   No LMP recorded.  Objective:   General:  alert, cooperative and no distress   Breasts:  deferred, no complaints  Lungs: clear to auscultation bilaterally  Heart:  regular rate and rhythm  Abdomen: soft, nontender   Vulva: normal  Vagina: normal vagina  Cervix:  closed  Corpus: Well-involuted  Adnexa:  Non-palpable  Rectal Exam: no hemorrhoids        Assessment:   Postpartum exam 6 wks s/p SVD Breastfeeding Depression screening Contraception counseling   Plan:  : natural family planning Follow up in: 6 months for annual exam or earlier if needed.  BP elevated, pt state she does have hx of white coat, repeat bp same. Pt has cuff at home will monitor at home once a day over the next week. Pt advised if diastolic remains above 90 to make appointment with PCP for follow up. She verbalizes and agrees to plan.   , CNM

## 2019-10-29 NOTE — Patient Instructions (Signed)

## 2020-04-28 ENCOUNTER — Encounter: Payer: BLUE CROSS/BLUE SHIELD | Admitting: Certified Nurse Midwife

## 2020-07-16 ENCOUNTER — Encounter: Payer: BLUE CROSS/BLUE SHIELD | Admitting: Certified Nurse Midwife

## 2020-07-28 ENCOUNTER — Other Ambulatory Visit: Payer: Self-pay

## 2020-07-28 ENCOUNTER — Ambulatory Visit (INDEPENDENT_AMBULATORY_CARE_PROVIDER_SITE_OTHER): Payer: BLUE CROSS/BLUE SHIELD | Admitting: Certified Nurse Midwife

## 2020-07-28 ENCOUNTER — Encounter: Payer: Self-pay | Admitting: Certified Nurse Midwife

## 2020-07-28 ENCOUNTER — Other Ambulatory Visit (HOSPITAL_COMMUNITY)
Admission: RE | Admit: 2020-07-28 | Discharge: 2020-07-28 | Disposition: A | Discharge status: Home / Self Care | Payer: BLUE CROSS/BLUE SHIELD | Source: Ambulatory Visit | Attending: Certified Nurse Midwife | Admitting: Certified Nurse Midwife

## 2020-07-28 VITALS — BP 128/88 | HR 74 | Ht 63.0 in | Wt 113.3 lb

## 2020-07-28 DIAGNOSIS — Z124 Encounter for screening for malignant neoplasm of cervix: Secondary | ICD-10-CM

## 2020-07-28 DIAGNOSIS — Z01419 Encounter for gynecological examination (general) (routine) without abnormal findings: Secondary | ICD-10-CM | POA: Diagnosis not present

## 2020-07-28 NOTE — Progress Notes (Signed)
GYNECOLOGY ANNUAL PREVENTATIVE CARE ENCOUNTER NOTE  History:     Phyllis Jones is a 29 y.o. G80P1001 female here for a routine annual gynecologic exam.  Current complaints: none.   Denies abnormal vaginal bleeding, discharge, pelvic pain, problems with intercourse or other gynecologic concerns.     Social Relationship: married  Living: spouse and daugher Work: Physical therapist in [place] Exercise: 2 x wk Smoke/Alcohol/drug use: denies   Gynecologic History Patient's last menstrual period was 07/08/2020 (exact date). Contraception: natural family planning Last Pap: 2019. Results were: normal per pt Last mammogram: n/a  Upstream - 07/28/20 8341      Pregnancy Intention Screening   Does the patient want to become pregnant in the next year? Unsure    Does the patient's partner want to become pregnant in the next year? Unsure    Would the patient like to discuss contraceptive options today? No      Contraception Wrap Up   Current Method FAM or LAM    End Method FAM or LAM    Contraception Counseling Provided No          The pregnancy intention screening data noted above was reviewed. Potential methods of contraception were discussed. The patient elected to proceed with No Method - Other Reason Natural family planning.   Obstetric History OB History  Gravida Para Term Preterm AB Living  1 1 1     1   SAB TAB Ectopic Multiple Live Births        0 1    # Outcome Date GA Lbr Len/2nd Weight Sex Delivery Anes PTL Lv  1 Term 09/12/19 [redacted]w[redacted]d 08:26 / 02:00 9 lb 0.6 oz (4.1 kg) F Vag-Spont EPI  LIV    Past Medical History:  Diagnosis Date   Anemia     Past Surgical History:  Procedure Laterality Date   TONSILLECTOMY  2004    No current outpatient medications on file prior to visit.   No current facility-administered medications on file prior to visit.    No Known Allergies  Social History:  reports that she has never smoked. She has never used smokeless  tobacco. She reports previous alcohol use. She reports that she does not use drugs.  Family History  Problem Relation Age of Onset   Breast cancer Neg Hx    Ovarian cancer Neg Hx    Colon cancer Neg Hx     The following portions of the patient's history were reviewed and updated as appropriate: allergies, current medications, past family history, past medical history, past social history, past surgical history and problem list.  Review of Systems Pertinent items noted in HPI and remainder of comprehensive ROS otherwise negative.  Physical Exam:  BP 128/88    Pulse 74    Ht 5\' 3"  (1.6 m)    Wt 113 lb 5 oz (51.4 kg)    LMP 07/08/2020 (Exact Date)    Breastfeeding No    BMI 20.07 kg/m  CONSTITUTIONAL: Well-developed, well-nourished female in no acute distress.  HENT:  Normocephalic, atraumatic, External right and left ear normal. Oropharynx is clear and moist EYES: Conjunctivae and EOM are normal. Pupils are equal, round, and reactive to light. No scleral icterus.  NECK: Normal range of motion, supple, no masses.  Normal thyroid.  SKIN: Skin is warm and dry. No rash noted. Not diaphoretic. No erythema. No pallor. MUSCULOSKELETAL: Normal range of motion. No tenderness.  No cyanosis, clubbing, or edema.  2+ distal pulses. NEUROLOGIC: Alert  and oriented to person, place, and time. Normal reflexes, muscle tone coordination.  PSYCHIATRIC: Normal mood and affect. Normal behavior. Normal judgment and thought content. CARDIOVASCULAR: Normal heart rate noted, regular rhythm RESPIRATORY: Clear to auscultation bilaterally. Effort and breath sounds normal, no problems with respiration noted. BREASTS: Symmetric in size. No masses, tenderness, skin changes, nipple drainage, or lymphadenopathy bilaterally.  ABDOMEN: Soft, no distention noted.  No tenderness, rebound or guarding.  PELVIC: Normal appearing external genitalia and urethral meatus; normal appearing vaginal mucosa and cervix.  No abnormal  discharge noted.  Pap smear obtained.  Normal uterine size, no other palpable masses, no uterine or adnexal tenderness.  .   Assessment and Plan:    1. Women's annual routine gynecological examination  Pap: collected will follow up with results  Mammogram :n/a Labs:declines- lipid & hep c Refills/orders: none , declines flu Referral: none Routine preventative health maintenance measures emphasized. Please refer to After Visit Summary for other counseling recommendations.      Doreene Burke, CNM [facility],  [facility]

## 2020-07-28 NOTE — Patient Instructions (Signed)
Preventive Care 21-27 Years Old, Female Preventive care refers to visits with your health care provider and lifestyle choices that can promote health and wellness. This includes:  A yearly physical exam. This may also be called an annual well check.  Regular dental visits and eye exams.  Immunizations.  Screening for certain conditions.  Healthy lifestyle choices, such as eating a healthy diet, getting regular exercise, not using drugs or products that contain nicotine and tobacco, and limiting alcohol use. What can I expect for my preventive care visit? Physical exam Your health care provider will check your:  Height and weight. This may be used to calculate body mass index (BMI), which tells if you are at a healthy weight.  Heart rate and blood pressure.  Skin for abnormal spots. Counseling Your health care provider may ask you questions about your:  Alcohol, tobacco, and drug use.  Emotional well-being.  Home and relationship well-being.  Sexual activity.  Eating habits.  Work and work environment.  Method of birth control.  Menstrual cycle.  Pregnancy history. What immunizations do I need?  Influenza (flu) vaccine  This is recommended every year. Tetanus, diphtheria, and pertussis (Tdap) vaccine  You may need a Td booster every 10 years. Varicella (chickenpox) vaccine  You may need this if you have not been vaccinated. Human papillomavirus (HPV) vaccine  If recommended by your health care provider, you may need three doses over 6 months. Measles, mumps, and rubella (MMR) vaccine  You may need at least one dose of MMR. You may also need a second dose. Meningococcal conjugate (MenACWY) vaccine  One dose is recommended if you are age 19-21 years and a first-year college student living in a residence hall, or if you have one of several medical conditions. You may also need additional booster doses. Pneumococcal conjugate (PCV13) vaccine  You may need  this if you have certain conditions and were not previously vaccinated. Pneumococcal polysaccharide (PPSV23) vaccine  You may need one or two doses if you smoke cigarettes or if you have certain conditions. Hepatitis A vaccine  You may need this if you have certain conditions or if you travel or work in places where you may be exposed to hepatitis A. Hepatitis B vaccine  You may need this if you have certain conditions or if you travel or work in places where you may be exposed to hepatitis B. Haemophilus influenzae type b (Hib) vaccine  You may need this if you have certain conditions. You may receive vaccines as individual doses or as more than one vaccine together in one shot (combination vaccines). Talk with your health care provider about the risks and benefits of combination vaccines. What tests do I need?  Blood tests  Lipid and cholesterol levels. These may be checked every 5 years starting at age 20.  Hepatitis C test.  Hepatitis B test. Screening  Diabetes screening. This is done by checking your blood sugar (glucose) after you have not eaten for a while (fasting).  Sexually transmitted disease (STD) testing.  BRCA-related cancer screening. This may be done if you have a family history of breast, ovarian, tubal, or peritoneal cancers.  Pelvic exam and Pap test. This may be done every 3 years starting at age 21. Starting at age 30, this may be done every 5 years if you have a Pap test in combination with an HPV test. Talk with your health care provider about your test results, treatment options, and if necessary, the need for more tests.   Follow these instructions at home: Eating and drinking   Eat a diet that includes fresh fruits and vegetables, whole grains, lean protein, and low-fat dairy.  Take vitamin and mineral supplements as recommended by your health care provider.  Do not drink alcohol if: ? Your health care provider tells you not to drink. ? You are  pregnant, may be pregnant, or are planning to become pregnant.  If you drink alcohol: ? Limit how much you have to 0-1 drink a day. ? Be aware of how much alcohol is in your drink. In the [place] one drink equals one 12 oz bottle of beer (355 mL), one 5 oz glass of wine (148 mL), or one 1 oz glass of hard liquor (44 mL). Lifestyle  Take daily care of your teeth and gums.  Stay active. Exercise for at least 30 minutes on 5 or more days each week.  Do not use any products that contain nicotine or tobacco, such as cigarettes, e-cigarettes, and chewing tobacco. If you need help quitting, ask your health care provider.  If you are sexually active, practice safe sex. Use a condom or other form of birth control (contraception) in order to prevent pregnancy and STIs (sexually transmitted infections). If you plan to become pregnant, see your health care provider for a preconception visit. What's next?  Visit your health care provider once a year for a well check visit.  Ask your health care provider how often you should have your eyes and teeth checked.  Stay up to date on all vaccines. This information is not intended to replace advice given to you by your health care provider. Make sure you discuss any questions you have with your health care provider. Document Revised: 05/11/2018 Document Reviewed: 05/11/2018 Elsevier Patient Education  2020 [employer].

## 2020-07-31 LAB — CYTOLOGY - PAP: Diagnosis: NEGATIVE

## 2020-09-13 NOTE — L&D Delivery Note (Addendum)
Date of delivery: 08/24/2021 Estimated Date of Delivery: 09/03/21 Patient's last menstrual period was 11/27/2020 (approximate). EGA: [redacted]w[redacted]d  Delivery Note At 4:35 PM a viable female was delivered via Vaginal, Spontaneous  Presentation: OA, Right Occiput Transverse)  APGAR: 8, 9;    Weight: 3590 g, 7 pounds 15 ounces Placenta status: Spontaneous, Intact.   Cord: 3 vessels with the following complications: None.  Cord pH: NA  Called to see patient.  Mom pushed to deliver a viable female infant.  The head followed by shoulders, which delivered without difficulty, and the rest of the body.  No nuchal cord noted.  Baby to mom's chest.  Cord clamped and cut after 3 min delay.  No cord blood obtained.  Placenta delivered spontaneously, intact, with a 3-vessel cord.  Second degree perineal laceration repaired with 3-0 Vicryl in standard fashion.  All counts correct.  Hemostasis obtained with IV pitocin and fundal massage.    Anesthesia: Epidural Episiotomy: None Lacerations: second degree Suture Repair: 3.0 vicryl Est. Blood Loss (mL): 550  Mom to postpartum.  Baby to Couplet care / Skin to Skin.  Tresea Mall, CNM 08/24/2021, 5:15 PM

## 2021-01-08 ENCOUNTER — Ambulatory Visit (INDEPENDENT_AMBULATORY_CARE_PROVIDER_SITE_OTHER): Payer: BLUE CROSS/BLUE SHIELD | Admitting: Certified Nurse Midwife

## 2021-01-08 ENCOUNTER — Encounter: Payer: Self-pay | Admitting: Certified Nurse Midwife

## 2021-01-08 ENCOUNTER — Other Ambulatory Visit: Payer: Self-pay

## 2021-01-08 VITALS — BP 136/86 | HR 80 | Ht 63.0 in | Wt 112.3 lb

## 2021-01-08 DIAGNOSIS — Z3201 Encounter for pregnancy test, result positive: Secondary | ICD-10-CM | POA: Diagnosis not present

## 2021-01-08 DIAGNOSIS — O3680X0 Pregnancy with inconclusive fetal viability, not applicable or unspecified: Secondary | ICD-10-CM | POA: Diagnosis not present

## 2021-01-08 DIAGNOSIS — N912 Amenorrhea, unspecified: Secondary | ICD-10-CM | POA: Diagnosis not present

## 2021-01-08 LAB — POCT URINE PREGNANCY: Preg Test, Ur: POSITIVE — AB

## 2021-01-08 NOTE — Progress Notes (Signed)
GYN ENCOUNTER NOTE  Subjective:       Phyllis Jones is a 30 y.o. G58P1001 female here for pregnancy confirmation.   Reports positive home pregnancy test. Endorses fatigue.   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   Denies difficulty breathing or respiratory distress, chest pain, abdominal pain, vaginal bleeding, dysuria, and leg pain or swelling.    Gynecologic History  Patient's last menstrual period was 11/27/2020. (30-35 day cycles)  Gestational age: 66 weeks 0 days  Estimated date of birth: 09/03/2021  Contraception: none   Last Pap: 07/2020. Results were: normal  Obstetric History OB History  Gravida Para Term Preterm AB Living  2 1 1     1   SAB IAB Ectopic Multiple Live Births        0 1    # Outcome Date GA Lbr Len/2nd Weight Sex Delivery Anes PTL Lv  2 Current           1 Term 09/12/19 [redacted]w[redacted]d 08:26 / 02:00 9 lb 0.6 oz (4.1 kg) F Vag-Spont EPI  LIV    Past Medical History:  Diagnosis Date  . Anemia     Past Surgical History:  Procedure Laterality Date  . TONSILLECTOMY  2004    Current Outpatient Medications on File Prior to Visit  Medication Sig Dispense Refill  . Prenatal Vit-Fe Fumarate-FA (MULTIVITAMIN-PRENATAL) 27-0.8 MG TABS tablet Take 1 tablet by mouth daily at 12 noon.     No current facility-administered medications on file prior to visit.    No Known Allergies  Social History   Socioeconomic History  . Marital status: Married    Spouse name: 2005  . Number of children: Not on file  . Years of education: Not on file  . Highest education level: Not on file  Occupational History  . Not on file  Tobacco Use  . Smoking status: Never Smoker  . Smokeless tobacco: Never Used  Vaping Use  . Vaping Use: Never used  Substance and Sexual Activity  . Alcohol use: Not Currently  . Drug use: Never  . Sexual activity: Yes    Birth control/protection: None    Comment:  planning basal temp check   Other Topics Concern  . Not on file  Social History Narrative  . Not on file   Social Determinants of Health   Financial Resource Strain: Not on file  Food Insecurity: Not on file  Transportation Needs: Not on file  Physical Activity: Not on file  Stress: Not on file  Social Connections: Not on file  Intimate Partner Violence: Not on file    Family History  Problem Relation Age of Onset  . Breast cancer Neg Hx   . Ovarian cancer Neg Hx   . Colon cancer Neg Hx     The following portions of the patient's history were reviewed and updated as appropriate: allergies, current medications, past family history, past medical history, past social history, past surgical history and problem list.  Review of Systems  ROS negative except as noted above. Information obtained from patient.   Objective:   BP 136/86   Pulse 80   Ht 5\' 3"  (1.6 m)   Wt 112 lb 4.8 oz (50.9 kg)   LMP 11/27/2020   BMI 19.89 kg/m    CONSTITUTIONAL: Well-developed, well-nourished female in no acute distress.   PHYSICAL EXAM: Not indicated.   Recent Results (from the past 2160 hour(s))  POCT urine pregnancy  Status: Abnormal   Collection Time: 01/08/21  4:26 PM  Result Value Ref Range   Preg Test, Ur Positive (A) Negative   . Assessment:   1. Amenorrhea  - POCT urine pregnancy  2. Positive pregnancy test   3. Pregnancy with uncertain fetal viability, single or unspecified fetus    Plan:   First trimester education, see AVS.   CNM to reach out to Pelvic Floor Physical Therapist regarding safety of Lumbopelvic Manipulation in the first trimester.   Reviewed red flag symptoms and when to call.   Schedule dating/viability ultrasound.   RTC x 2-3 weeks for intake.   RTC x 5-6 weeks for NOB PE or sooner if needed.

## 2021-01-08 NOTE — Patient Instructions (Signed)
WHAT OB PATIENTS CAN EXPECT   Confirmation of pregnancy and ultrasound ordered if medically indicated-[redacted] weeks gestation  New OB (NOB) intake with nurse and New OB (NOB) labs- [redacted] weeks gestation  New OB (NOB) physical examination with provider- 11/[redacted] weeks gestation  Flu vaccine-[redacted] weeks gestation  Anatomy scan-[redacted] weeks gestation  Glucose tolerance test, blood work to test for anemia, T-dap vaccine-[redacted] weeks gestation  Vaginal swabs/cultures-STD/Group B strep-[redacted] weeks gestation  Appointments every 4 weeks until 28 weeks  Every 2 weeks from 28 weeks until 36 weeks  Weekly visits from 36 weeks until delivery  [email or web address]  Prenatal Care Prenatal care is health care during pregnancy. It helps you and your unborn baby (fetus) stay as healthy as possible. Prenatal care may be provided by a midwife, a family practice doctor, a mid-level practitioner (nurse practitioner or physician assistant), or a childbirth and pregnancy doctor (obstetrician). How does this affect me? During pregnancy, you will be closely monitored for any new conditions that might develop. To lower your risk of pregnancy complications, you and your health care provider will talk about any underlying conditions you have. How does this affect my baby? Early and consistent prenatal care increases the chance that your baby will be healthy during pregnancy. Prenatal care lowers the risk that your baby will be:  Born early (prematurely).  Smaller than expected at birth (small for gestational age). What can I expect at the first prenatal care visit? Your first prenatal care visit will likely be the longest. You should schedule your first prenatal care visit as soon as you know that you are pregnant. Your first visit is a good time to talk about any questions or concerns you have about pregnancy. Medical history At your visit, you and your health care provider will  talk about your medical history, including:  Any past pregnancies.  Your family's medical history.  Medical history of the baby's father.  Any long-term (chronic) health conditions you have and how you manage them.  Any surgeries or procedures you have had.  Any current over-the-counter or prescription medicines, herbs, or supplements that you are taking.  Other factors that could pose a risk to your baby, including: ? Exposure to harmful chemicals or radiation at work or at home. ? Any substance use, including tobacco, alcohol, and drug use.  Your home setting and your stress levels, including: ? Exposure to abuse or violence. ? Household financial strain.  Your daily health habits, including diet and exercise. Tests and screenings Your health care provider will:  Measure your weight, height, and blood pressure.  Do a physical exam, including a pelvic and breast exam.  Perform blood tests and urine tests to check for: ? Urinary tract infection. ? Sexually transmitted infections (STIs). ? Low iron levels in your blood (anemia). ? Blood type and certain proteins on red blood cells (Rh antibodies). ? Infections and immunity to viruses, such as hepatitis B and rubella. ? HIV (human immunodeficiency virus).  Discuss your options for genetic screening. Tips about staying healthy Your health care provider will also give you information about how to keep yourself and your baby healthy, including:  Nutrition and taking vitamins.  Physical activity.  How to manage pregnancy symptoms such as nausea and vomiting (morning sickness).  Infections and substances that may be harmful to your baby and how to avoid them.  Food safety.  Dental care.  Working.  Travel.  Warning signs to watch for and when to call your health care provider.   How often will I have prenatal care visits? After your first prenatal care visit, you will have regular visits throughout your pregnancy.  The visit schedule is often as follows:  Up to week 28 of pregnancy: once every 4 weeks.  28-36 weeks: once every 2 weeks.  After 36 weeks: every week until delivery. Some women may have visits more or less often depending on any underlying health conditions and the health of the baby. Keep all follow-up and prenatal care visits. This is important. What happens during routine prenatal care visits? Your health care provider will:  Measure your weight and blood pressure.  Check for fetal heart sounds.  Measure the height of your uterus in your abdomen (fundal height). This may be measured starting around week 20 of pregnancy.  Check the position of your baby inside your uterus.  Ask questions about your diet, sleeping patterns, and whether you can feel the baby move.  Review warning signs to watch for and signs of labor.  Ask about any pregnancy symptoms you are having and how you are dealing with them. Symptoms may include: ? Headaches. ? Nausea and vomiting. ? Vaginal discharge. ? Swelling. ? Fatigue. ? Constipation. ? Changes in your vision. ? Feeling persistently sad or anxious. ? Any discomfort, including back or pelvic pain. ? Bleeding or spotting. Make a list of questions to ask your health care provider at your routine visits.   What tests might I have during prenatal care visits? You may have blood, urine, and imaging tests throughout your pregnancy, such as:  Urine tests to check for glucose, protein, or signs of infection.  Glucose tests to check for a form of diabetes that can develop during pregnancy (gestational diabetes mellitus). This is usually done around week 24 of pregnancy.  Ultrasounds to check your baby's growth and development, to check for birth defects, and to check your baby's well-being. These can also help to decide when you should deliver your baby.  A test to check for group B strep (GBS) infection. This is usually done around week 36 of  pregnancy.  Genetic testing. This may include blood, fluid, or tissue sampling, or imaging tests, such as an ultrasound. Some genetic tests are done during the first trimester and some are done during the second trimester. What else can I expect during prenatal care visits? Your health care provider may recommend getting certain vaccines during pregnancy. These may include:  A yearly flu shot (annual influenza vaccine). This is especially important if you will be pregnant during flu season.  Tdap (tetanus, diphtheria, pertussis) vaccine. Getting this vaccine during pregnancy can protect your baby from whooping cough (pertussis) after birth. This vaccine may be recommended between weeks 27 and 36 of pregnancy.  A COVID-19 vaccine. Later in your pregnancy, your health care provider may give you information about:  Childbirth and breastfeeding classes.  Choosing a health care provider for your baby.  Umbilical cord banking.  Breastfeeding.  Birth control after your baby is born.  The hospital labor and delivery unit and how to set up a tour.  Registering at the hospital before you go into labor. Where to find more information  Office on Women's Health: womenshealth.gov  American Pregnancy Association: americanpregnancy.org  March of Dimes: marchofdimes.org Summary  Prenatal care helps you and your baby stay as healthy as possible during pregnancy.  Your first prenatal care visit will most likely be the longest.  You will have visits and tests throughout your pregnancy to monitor   to monitor your health and your baby's health.  Bring a list of questions to your visits to ask your health care provider.  Make sure to keep all follow-up and prenatal care visits. This information is not intended to replace advice given to you by your health care provider. Make sure you discuss any questions you have with your health care provider. Document Revised: 06/12/2020 Document Reviewed:  06/12/2020 Elsevier Patient Education  2021 [place] [employer] Gain During Pregnancy A certain amount of weight gain during pregnancy is normal and healthy. The amount of weight you should gain during pregnancy depends on your overall health and your weight before you became pregnant. Talk with your health care provider to find out how much weight you should gain during your pregnancy. General guidelines for a healthy total weight gain during pregnancy are based on your body mass index (BMI) and are listed below. If your BMI at or before the start of your pregnancy is:  Less than 18.5 (underweight), you should gain 28-40 lb (13-18 kg).  18.5-24.9 (normal weight), you should gain 25-35 lb (11-16 kg).  25-29.9 (overweight), you should gain 15-25 lb (7-11 kg).  30 or higher (obese), you should gain 11-20 lb (5-9 kg). Your health care provider may recommend that you:  Gain more weight, if you were underweight before pregnancy or if you are pregnant with more than one baby.  Gain less weight, if you were overweight before pregnancy or if you are gaining too much weight during your pregnancy. How does unhealthy weight gain affect me? Gaining too much weight during pregnancy can lead to pregnancy complications, such as:  A temporary form of diabetes that develops during pregnancy (gestational diabetes).  Hypertensive disorders of pregnancy (preeclampsia or gestational hypertension).  Raising your risk of having a more difficult delivery or a surgical delivery (cesarean delivery, or C-section). How does unhealthy weight gain affect my baby? Not gaining enough weight can be life-threatening for your baby. It may also raise these risks for your baby:  Being born early (preterm).  Being smaller than normal during pregnancy or not growing normally (intrauterine growth restriction).  Having a low weight at birth. Gaining too much weight may raise these risks for your  baby:  Growing larger than normal during pregnancy (large for gestational age or macrosomia).  Increased risk of obesity. What actions can I take to gain a healthy amount of weight during pregnancy? Nutrition  Eat healthy foods. Every day, try to eat: ? Fruits and vegetables. Include a variety of colors and types, like sweet potatoes, oranges, apples, bell peppers, beets, berries, squash, and broccoli. ? Whole grains, such as millet, barley, whole-wheat breads and cereals, and oatmeal. ? Low-fat dairy products, like yogurt, milk, and cheese. You can also include non-dairy choices, like almond milk or rice milk. ? Protein-rich foods, like lean meat, chicken, eggs, peas, and beans.  Avoid foods that are fried or have a lot of fat, salt (sodium), or sugar.  Drink enough fluid to keep your urine pale yellow.  Choose healthy snack and drink options when you are away from home: ? Drink water. Avoid soda, sports drinks, and juices that have added sugar. ? Avoid drinks with caffeine, such as coffee and energy drinks. ? Eat snacks that are high in protein, such as nuts, protein bars, and low-fat yogurt. ? Carry convenient snacks with you that do not need refrigeration, such as a pack of trail mix, an apple, or a granola bar.  If you need help improving your diet, work with a health care provider or a dietitian.   Activity  Exercise regularly, as told by your health care provider. ? If you were active before becoming pregnant, you may be able to continue your regular fitness activities. ? If you were not active before pregnancy, you may gradually build up to exercising for 30 or more minutes on most days of the week. This may include walking, swimming, or yoga.  Ask your health care provider what activities are safe for you. Talk with your health care provider about whether you may need to be excused from certain school or work activities.   Follow these instructions at home:  Take  over-the-counter and prescription medicines only as told by your health care provider.  Take all prenatal supplements as told by your health care provider.  Keep track of your weight gain during pregnancy.  Keep all health care visits during pregnancy (prenatal visits). These visits are a good time to discuss your weight gain. Your health care provider will weigh you at each visit to make sure you are gaining a healthy amount of weight. Where to find support Some pregnant women and teens face unique challenges and need extra support. If you have questions or need help gaining a healthy amount of weight during pregnancy, these people may help:  Your health care provider.  A dietitian.  Your school nurse.  Your family and friends.  Local counseling centers, church groups, or clinics that have services for teens. Where to find more information Learn more about managing your weight gain during pregnancy from:  American Pregnancy Association: americanpregnancy.org  [place] Department of Agriculture pregnancy weight gain calculator: WrestlingReporter.dk Contact a health care provider if:  You are unable to eat or drink for longer than 24 hours.  You cannot afford food or have trouble accessing regular meals. Get help right away if you: Summary  Talk with your health care provider to find out how much weight you should gain during your pregnancy.  Too much or too little weight gain during pregnancy can lead to complications for you and your baby.  Eat healthy foods like fruits and vegetables, whole grains, low-fat dairy products, and protein-rich foods.  Ask your health care provider what activities are safe for you.  Keep all of your prenatal visits. This information is not intended to replace advice given to you by your health care provider. Make sure you discuss any questions you have with your health care provider. Document Revised: 03/27/2020 Document Reviewed: 03/27/2020 Elsevier  Patient Education  2021 Elsevier Inc.   Common Medications Safe in Pregnancy  Acne:      Constipation:  Benzoyl Peroxide     Colace  Clindamycin      Dulcolax Suppository  Topica Erythromycin     Fibercon  Salicylic Acid      Metamucil         Miralax AVOID:        Senakot   Accutane    Cough:  Retin-A       Cough Drops  Tetracycline      Phenergan w/ Codeine if Rx  Minocycline      Robitussin (Plain & DM)  Antibiotics:     Crabs/Lice:  Ceclor       RID  Cephalosporins    AVOID:  E-Mycins      Kwell  Keflex  Macrobid/Macrodantin   Diarrhea:  Penicillin      Kao-Pectate  Zithromax  Imodium AD         PUSH FLUIDS AVOID:       Cipro     Fever:  Tetracycline      Tylenol (Regular or Extra  Minocycline       Strength)  Levaquin      Extra Strength-Do not          Exceed 8 tabs/24 hrs Caffeine:        '200mg'$ /day (equiv. To 1 cup of coffee or  approx. 3 12 oz sodas)         Gas: Cold/Hayfever:       Gas-X  Benadryl      Mylicon  Claritin       Phazyme  **Claritin-D        Chlor-Trimeton    Headaches:  Dimetapp      ASA-Free Excedrin  Drixoral-Non-Drowsy     Cold Compress  Mucinex (Guaifenasin)     Tylenol (Regular or Extra  Sudafed/Sudafed-12 Hour     Strength)  **Sudafed PE Pseudoephedrine   Tylenol Cold & Sinus     Vicks Vapor Rub  Zyrtec  **AVOID if Problems With Blood Pressure         Heartburn: Avoid lying down for at least 1 hour after meals  Aciphex      Maalox     Rash:  Milk of Magnesia     Benadryl    Mylanta       1% Hydrocortisone Cream  Pepcid  Pepcid Complete   Sleep Aids:  Prevacid      Ambien   Prilosec       Benadryl  Rolaids       Chamomile Tea  Tums (Limit 4/day)     Unisom         Tylenol PM         Warm milk-add vanilla or  Hemorrhoids:       Sugar for taste  Anusol/Anusol H.C.  (RX: Analapram 2.5%)  Sugar Substitutes:  Hydrocortisone OTC     Ok in moderation  Preparation H      Tucks        Vaseline lotion applied to  tissue with wiping    Herpes:     Throat:  Acyclovir      Oragel  Famvir  Valtrex     Vaccines:         Flu Shot Leg Cramps:       *Gardasil  Benadryl      Hepatitis A         Hepatitis B Nasal Spray:       Pneumovax  Saline Nasal Spray     Polio Booster         Tetanus Nausea:       Tuberculosis test or PPD  Vitamin B6 25 mg TID   AVOID:    Dramamine      *Gardasil  Emetrol       Live Poliovirus  Ginger Root 250 mg QID    MMR (measles, mumps &  High Complex Carbs @ Bedtime    rebella)  Sea Bands-Accupressure    Varicella (Chickenpox)  Unisom 1/2 tab TID     *No known complications           If received before Pain:         Known pregnancy;   Darvocet       Resume series after  Lortab        Delivery  Percocet  Yeast:   Tramadol      Femstat  Tylenol 3      Gyne-lotrimin  Ultram       Monistat  Vicodin           MISC:         All Sunscreens           Hair Coloring/highlights          Insect Repellant's          (Including DEET)         Mystic Tans   [email or web address]">  First Trimester of Pregnancy  The first trimester of pregnancy starts on the first day of your last menstrual period until the end of week 12. This is also called months 1 through 3 of pregnancy. Body changes during your first trimester Your body goes through many changes during pregnancy. The changes usually return to normal after your baby is born. Physical changes  You may gain or lose weight.  Your breasts may grow larger and hurt. The area around your nipples may get darker.  Dark spots or blotches may develop on your face.  You may have changes in your hair. Health changes  You may feel like you might vomit (nauseous), and you may vomit.  You may have heartburn.  You may have headaches.  You may have trouble pooping (constipation).  Your gums may bleed. Other changes  You may get tired easily.  You may pee (urinate) more often.  Your menstrual  periods will stop.  You may not feel hungry.  You may want to eat certain kinds of food.  You may have changes in your emotions from day to day.  You may have more dreams. Follow these instructions at home: Medicines  Take over-the-counter and prescription medicines only as told by your doctor. Some medicines are not safe during pregnancy.  Take a prenatal vitamin that contains at least 600 micrograms (mcg) of folic acid. Eating and drinking  Eat healthy meals that include: ? Fresh fruits and vegetables. ? Whole grains. ? Good sources of protein, such as meat, eggs, or tofu. ? Low-fat dairy products.  Avoid raw meat and unpasteurized juice, milk, and cheese.  If you feel like you may vomit, or you vomit: ? Eat 4 or 5 small meals a day instead of 3 large meals. ? Try eating a few soda crackers. ? Drink liquids between meals instead of during meals.  You may need to take these actions to prevent or treat trouble pooping: ? Drink enough fluids to keep your pee (urine) pale yellow. ? Eat foods that are high in fiber. These include beans, whole grains, and fresh fruits and vegetables. ? Limit foods that are high in fat and sugar. These include fried or sweet foods. Activity  Exercise only as told by your doctor. Most people can do their usual exercise routine during pregnancy.  Stop exercising if you have cramps or pain in your lower belly (abdomen) or low back.  Do not exercise if it is too hot or too humid, or if you are in a place of great height (high altitude).  Avoid heavy lifting.  If you choose to, you may have sex unless your doctor tells you not to. Relieving pain and discomfort  Wear a good support bra if your breasts are sore.  Rest with your legs raised (elevated) if you have leg cramps or low back pain.  If you have bulging veins (varicose veins) in your legs: ?  Wear support hose as told by your doctor. ? Raise your feet for 15 minutes, 3-4 times a  day. ? Limit salt in your food. Safety  Wear your seat belt at all times when you are in a car.  Talk with your doctor if someone is hurting you or yelling at you.  Talk with your doctor if you are feeling sad or have thoughts of hurting yourself. Lifestyle  Do not use hot tubs, steam rooms, or saunas.  Do not douche. Do not use tampons or scented sanitary pads.  Do not use herbal medicines, illegal drugs, or medicines that are not approved by your doctor. Do not drink alcohol.  Do not smoke or use any products that contain nicotine or tobacco. If you need help quitting, ask your doctor.  Avoid cat litter boxes and soil that is used by cats. These carry germs that can cause harm to the baby and can cause a loss of your baby by miscarriage or stillbirth. General instructions  Keep all follow-up visits. This is important.  Ask for help if you need counseling or if you need help with nutrition. Your doctor can give you advice or tell you where to go for help.  Visit your dentist. At home, brush your teeth with a soft toothbrush. Floss gently.  Write down your questions. Take them to your prenatal visits. Where to find more information  American Pregnancy Association: americanpregnancy.org  [employer] of Obstetricians and Gynecologists: www.acog.org  Office on Women's Health: KeywordPortfolios.com.br Contact a doctor if:  You are dizzy.  You have a fever.  You have mild cramps or pressure in your lower belly.  You have a nagging pain in your belly area.  You continue to feel like you may vomit, you vomit, or you have watery poop (diarrhea) for 24 hours or longer.  You have a bad-smelling fluid coming from your vagina.  You have pain when you pee.  You are exposed to a disease that spreads from person to person, such as chickenpox, measles, Zika virus, HIV, or hepatitis. Get help right away if:  You have spotting or bleeding from your vagina.  You have very  bad belly cramping or pain.  You have shortness of breath or chest pain.  You have any kind of injury, such as from a fall or a car crash.  You have new or increased pain, swelling, or redness in an arm or leg. Summary  The first trimester of pregnancy starts on the first day of your last menstrual period until the end of week 12 (months 1 through 3).  Eat 4 or 5 small meals a day instead of 3 large meals.  Do not smoke or use any products that contain nicotine or tobacco. If you need help quitting, ask your doctor.  Keep all follow-up visits. This information is not intended to replace advice given to you by your health care provider. Make sure you discuss any questions you have with your health care provider. Document Revised: 02/06/2020 Document Reviewed: 12/13/2019 Elsevier Patient Education  2021 [employer].

## 2021-02-10 ENCOUNTER — Ambulatory Visit (INDEPENDENT_AMBULATORY_CARE_PROVIDER_SITE_OTHER): Payer: BLUE CROSS/BLUE SHIELD | Admitting: Obstetrics and Gynecology

## 2021-02-10 ENCOUNTER — Other Ambulatory Visit (HOSPITAL_COMMUNITY)
Admission: RE | Admit: 2021-02-10 | Discharge: 2021-02-10 | Disposition: A | Discharge status: Home / Self Care | Payer: BLUE CROSS/BLUE SHIELD | Source: Ambulatory Visit | Attending: Obstetrics and Gynecology | Admitting: Obstetrics and Gynecology

## 2021-02-10 ENCOUNTER — Other Ambulatory Visit: Payer: Self-pay

## 2021-02-10 ENCOUNTER — Encounter: Payer: Self-pay | Admitting: Obstetrics and Gynecology

## 2021-02-10 VITALS — BP 112/68 | HR 70 | Wt 120.0 lb

## 2021-02-10 DIAGNOSIS — Z369 Encounter for antenatal screening, unspecified: Secondary | ICD-10-CM | POA: Diagnosis present

## 2021-02-10 DIAGNOSIS — Z3480 Encounter for supervision of other normal pregnancy, unspecified trimester: Secondary | ICD-10-CM | POA: Insufficient documentation

## 2021-02-10 DIAGNOSIS — Z113 Encounter for screening for infections with a predominantly sexual mode of transmission: Secondary | ICD-10-CM | POA: Diagnosis present

## 2021-02-10 NOTE — Progress Notes (Signed)
New Obstetric Patient H&P    Chief Complaint: "Desires prenatal care"   History of Present Illness: Patient is a 30 y.o. G2P1001 Not Hispanic or Latino female, presents with amenorrhea and positive home pregnancy test. Patient's last menstrual period was 11/27/2020 (approximate). and based on her  LMP, her EDD is Estimated Date of Delivery: 09/03/21 and her EGA is [redacted]w[redacted]d.Her last pap smear was 07/28/2020 and was no abnormalities.    Her last menstrual period was normal. Since her LMP she claims she has experienced nausea and fatigue. She denies vaginal bleeding. Her past medical history is noncontributory. Her prior pregnancies are notable for none  Since her LMP, she admits to the use of tobacco products  no There are cats in the home in the home  no She admits close contact with children on a regular basis  yes  She has had chicken pox in the past unknown She has had Tuberculosis exposures, symptoms, or previously tested positive for TB   no Current or past history of domestic violence. no  Genetic Screening/Teratology Counseling: (Includes patient, baby's father, or anyone in either family with:)   1. Patient's age >/= 73 at [facility]  no 2. Thalassemia ([place], [place], Mediterranean, or Asian background): MCV<80  no 3. Neural tube defect (meningomyelocele, spina bifida, anencephaly)  no 4. Congenital heart defect  no  5. Down syndrome  no 6. Tay-Sachs (Jewish, [place]  no 7. Canavan's Disease  no 8. Sickle cell disease or trait (African)  no  9. Hemophilia or other blood disorders  no  10. Muscular dystrophy  no  11. Cystic fibrosis  no  12. Huntington's Chorea  no  13. Mental retardation/autism  no 14. Other inherited genetic or chromosomal disorder  no 15. Maternal metabolic disorder (DM, PKU, etc)  no 16. Patient or FOB with a child with a birth defect not listed above no  16a. Patient or FOB with a birth defect themselves no 17. Recurrent pregnancy loss, or  stillbirth  no  18. Any medications since LMP other than prenatal vitamins (include vitamins, supplements, OTC meds, drugs, alcohol)  no 19. Any other genetic/environmental exposure to discuss  no  Infection History:   1. Lives with someone with TB or TB exposed  no  2. Patient or partner has history of genital herpes  no 3. Rash or viral illness since LMP  no 4. History of STI (GC, CT, HPV, syphilis, HIV)  no 5. History of recent travel :  no  Other pertinent information:  no     Review of Systems:10 point review of systems negative unless otherwise noted in HPI  Past Medical History:  Patient Active Problem List   Diagnosis Date Noted  . Pregnancy 09/11/2019    Past Surgical History:  Past Surgical History:  Procedure Laterality Date  . TONSILLECTOMY  2004    Gynecologic History: Patient's last menstrual period was 11/27/2020 (approximate).  Obstetric History: G2P1001  Family History:  Family History  Problem Relation Age of Onset  . Breast cancer Neg Hx   . Ovarian cancer Neg Hx   . Colon cancer Neg Hx     Social History:  Social History   Socioeconomic History  . Marital status: Married    Spouse name: Gaynell Face  . Number of children: Not on file  . Years of education: Not on file  . Highest education level: Not on file  Occupational History  . Not on file  Tobacco Use  . Smoking status:  Never Smoker  . Smokeless tobacco: Never Used  Vaping Use  . Vaping Use: Never used  Substance and Sexual Activity  . Alcohol use: Not Currently  . Drug use: Never  . Sexual activity: Yes    Birth control/protection: None    Comment: planning basal temp check   Other Topics Concern  . Not on file  Social History Narrative  . Not on file   Social Determinants of Health   Financial Resource Strain: Not on file  Food Insecurity: Not on file  Transportation Needs: Not on file  Physical Activity: Not on file  Stress: Not on file  Social Connections: Not on  file  Intimate Partner Violence: Not on file    Allergies:  No Known Allergies  Medications: Prior to Admission medications   Medication Sig Start Date End Date Taking? Authorizing Provider  Prenatal Vit-Fe Fumarate-FA (MULTIVITAMIN-PRENATAL) 27-0.8 MG TABS tablet Take 1 tablet by mouth daily at 12 noon.   Yes [provider]    Physical Exam Vitals: Blood pressure 112/68, pulse 70, weight 120 lb (54.4 kg), last menstrual period 11/27/2020, not currently breastfeeding. Body mass index is 21.26 kg/m.  General: NAD HEENT: normocephalic, anicteric Thyroid: no enlargement, no palpable nodules Pulmonary: No increased work of breathing, CTAB Cardiovascular: RRR, distal pulses 2+ Abdomen: NABS, soft, non-tender, non-distended.  Umbilicus without lesions.  No hepatomegaly, splenomegaly or masses palpable. No evidence of hernia  Genitourinary:  External: Normal external female genitalia.  Normal urethral meatus, normal  Bartholin's and Skene's glands.    Vagina: Normal vaginal mucosa, no evidence of prolapse.    Cervix: Grossly normal in appearance, no bleeding  Uterus: 8-10 weeks size, mobile, normal contour.  No CMT  Adnexa: ovaries non-enlarged, no adnexal masses  Rectal: deferred Extremities: no edema, erythema, or tenderness Neurologic: Grossly intact Psychiatric: mood appropriate, affect full  Immunization History  Administered Date(s) Administered  . Tdap 07/02/2019    Assessment: 30 y.o. G2P1001 at [redacted]w[redacted]d presenting to initiate prenatal care  Plan: 1) Avoid alcoholic beverages. 2) Patient encouraged not to smoke.  3) Discontinue the use of all non-medicinal drugs and chemicals.  4) Take prenatal vitamins daily.  5) Nutrition, food safety (fish, cheese advisories, and high nitrite foods) and exercise discussed. 6) Hospital and practice style discussed with cross coverage system.  7) Genetic Screening, such as with 1st Trimester Screening, cell free fetal DNA,  AFP testing, and Ultrasound, as well as with amniocentesis and CVS as appropriate, is discussed with patient. At the conclusion of today's visit patient undecided genetic testing 8) Return in about 1 week (around 02/17/2021) for ROB and dating scan (MD).   Vena Austria, MD, [facility], [facility] 02/10/2021, 9:26 AM

## 2021-02-11 LAB — RPR+RH+ABO+RUB AB+AB SCR+CB...
Antibody Screen: NEGATIVE
HIV Screen 4th Generation wRfx: NONREACTIVE
Hematocrit: 35.2 % (ref 34.0–46.6)
Hemoglobin: 10.9 g/dL — ABNORMAL LOW (ref 11.1–15.9)
Hepatitis B Surface Ag: NEGATIVE
MCH: 25.6 pg — ABNORMAL LOW (ref 26.6–33.0)
MCHC: 31 g/dL — ABNORMAL LOW (ref 31.5–35.7)
MCV: 83 fL (ref 79–97)
Platelets: 317 10*3/uL (ref 150–450)
RBC: 4.26 x10E6/uL (ref 3.77–5.28)
RDW: 15.5 % — ABNORMAL HIGH (ref 11.7–15.4)
RPR Ser Ql: NONREACTIVE
Rh Factor: POSITIVE
Rubella Antibodies, IGG: 8.02 index (ref >0.99)
Varicella zoster IgG: <135 index — ABNORMAL LOW (ref >165)
WBC: 9 10*3/uL (ref 3.4–10.8)

## 2021-02-11 LAB — CERVICOVAGINAL ANCILLARY ONLY
Chlamydia: NEGATIVE
Comment: NEGATIVE
Comment: NORMAL
Neisseria Gonorrhea: NEGATIVE

## 2021-02-12 LAB — URINE CULTURE

## 2021-02-16 ENCOUNTER — Encounter: Payer: BLUE CROSS/BLUE SHIELD | Admitting: Certified Nurse Midwife

## 2021-02-18 ENCOUNTER — Encounter: Payer: BLUE CROSS/BLUE SHIELD | Admitting: Obstetrics and Gynecology

## 2021-02-23 ENCOUNTER — Ambulatory Visit: Payer: BLUE CROSS/BLUE SHIELD

## 2021-02-23 ENCOUNTER — Ambulatory Visit (INDEPENDENT_AMBULATORY_CARE_PROVIDER_SITE_OTHER): Payer: BLUE CROSS/BLUE SHIELD | Admitting: Obstetrics and Gynecology

## 2021-02-23 ENCOUNTER — Other Ambulatory Visit: Payer: Self-pay

## 2021-02-23 VITALS — BP 118/72 | Wt 121.0 lb

## 2021-02-23 DIAGNOSIS — Z3689 Encounter for other specified antenatal screening: Secondary | ICD-10-CM

## 2021-02-23 DIAGNOSIS — Z3480 Encounter for supervision of other normal pregnancy, unspecified trimester: Secondary | ICD-10-CM

## 2021-02-23 DIAGNOSIS — Z3A12 12 weeks gestation of pregnancy: Secondary | ICD-10-CM

## 2021-02-23 LAB — POCT URINALYSIS DIPSTICK OB
Glucose, UA: NEGATIVE
POC,PROTEIN,UA: NEGATIVE

## 2021-02-23 NOTE — Progress Notes (Signed)
ROB - dating scan, no concerns. US RM 1 

## 2021-02-23 NOTE — Progress Notes (Signed)
    Routine Prenatal Care Visit  Subjective  Phyllis Jones is a 30 y.o. G2P1001 at [redacted]w[redacted]d being seen today for ongoing prenatal care.  She is currently monitored for the following issues for this low-risk pregnancy and has Pregnancy and Supervision of other normal pregnancy, antepartum on their problem list.  ----------------------------------------------------------------------------------- Patient reports no complaints.   Contractions: Not present. Vag. Bleeding: None.  Movement: Absent. Denies leaking of fluid.  ----------------------------------------------------------------------------------- The following portions of the patient's history were reviewed and updated as appropriate: allergies, current medications, past family history, past medical history, past social history, past surgical history and problem list. Problem list updated.   Objective  Blood pressure 118/72, weight 121 lb (54.9 kg), last menstrual period 11/27/2020, not currently breastfeeding. Pregravid weight 114 lb (51.7 kg) Total Weight Gain 7 lb (3.175 kg) Urinalysis:      Fetal Status: Fetal Heart Rate (bpm): 171   Movement: Absent     General:  Alert, oriented and cooperative. Patient is in no acute distress.  Skin: Skin is warm and dry. No rash noted.   Cardiovascular: Normal heart rate noted  Respiratory: Normal respiratory effort, no problems with respiration noted  Abdomen: Soft, gravid, appropriate for gestational age. Pain/Pressure: Absent     Pelvic:  Cervical exam deferred        Extremities: Normal range of motion.     ental Status: Normal mood and affect. Normal behavior. Normal judgment and thought content.   Immunization History  Administered Date(s) Administered   Tdap 07/02/2019     Assessment   29 y.o. G2P1001 at [redacted]w[redacted]d by  09/03/2021, by Last Menstrual Period presenting for routine prenatal visit  Plan   Pregnancy 2 Problems (from 02/10/21 to present)     Problem Noted Resolved    Supervision of other normal pregnancy, antepartum 02/10/2021 by Vena Austria, MD No   Overview Signed 02/23/2021  2:32 PM by Vena [place], MD    [facility] Prenatal Labs  Dating LMP = 12 week [place] Blood type: A/Positive/-- (05/31 1051)   Genetic Screen Declines Antibody:Negative (05/31 1051)  Anatomic [place]  Rubella: 8.02 (05/31 1051) Varicella: Non-Immune  GTT  Third trimester:  RPR: Non Reactive (05/31 1051)   Rhogam N/A HBsAg: Negative (05/31 1051)   TDaP vaccine                       Flu Shot: HIV: Non Reactive (05/31 1051)   Baby Food                                GBS:   Contraception  Pap: 07/28/2020 NILM   CBB     CS/VBAC N/A   Support Person Husband Trudie Buckler                 Gestational age appropriate obstetric precautions including but not limited to vaginal bleeding, contractions, leaking of fluid and fetal movement were reviewed in detail with the patient.    Return in about 4 weeks (around 03/23/2021) for ROB.  Vena [place], MD, [facility], [facility] 02/23/2021, 11:14 AM

## 2021-02-24 NOTE — Progress Notes (Signed)
Dating Scan  Singleton viable intrauterine pregnancy with a CRL of 5.72cm (5.80cm, 5.61cm, 5.76cm) consistent with [redacted]w[redacted]d.  [place] derived EDD of 09/02/2021 is consistent with LMP dating EDD of 09/03/2021.  FHT 171BPM ROV visualized normal LOV visualized normal YS not visualized No free fluid Normal myometrial echotexture no evidence of uterine fibroids  There is a viable singleton gestation.  The fetal biometry correlates with established dating. Detailed evaluation of the fetal anatomy is precluded by early gestational age.  It must be noted that a normal ultrasound particular at this early gestational age is unable to rule out fetal aneuploidy, risk of first trimester miscarriage, or anatomic birth defects.  Vena [place], MD, [facility], [facility]

## 2021-03-23 ENCOUNTER — Other Ambulatory Visit: Payer: Self-pay

## 2021-03-23 ENCOUNTER — Ambulatory Visit (INDEPENDENT_AMBULATORY_CARE_PROVIDER_SITE_OTHER): Payer: BLUE CROSS/BLUE SHIELD | Admitting: Obstetrics and Gynecology

## 2021-03-23 ENCOUNTER — Encounter: Payer: Self-pay | Admitting: Obstetrics and Gynecology

## 2021-03-23 VITALS — BP 100/70 | Wt 127.0 lb

## 2021-03-23 DIAGNOSIS — Z3482 Encounter for supervision of other normal pregnancy, second trimester: Secondary | ICD-10-CM

## 2021-03-23 DIAGNOSIS — Z3A16 16 weeks gestation of pregnancy: Secondary | ICD-10-CM

## 2021-03-23 LAB — POCT URINALYSIS DIPSTICK OB
Glucose, UA: NEGATIVE
POC,PROTEIN,UA: NEGATIVE

## 2021-03-23 NOTE — Progress Notes (Signed)
  Routine Prenatal Care Visit  Subjective  Phyllis Jones is a 30 y.o. G2P1001 at [redacted]w[redacted]d being seen today for ongoing prenatal care.  She is currently monitored for the following issues for this low-risk pregnancy and has Pregnancy and Supervision of other normal pregnancy, antepartum on their problem list.  ----------------------------------------------------------------------------------- Patient reports no complaints.   Contractions: Not present. Vag. Bleeding: None.  Movement: Absent. Leaking Fluid denies.  ----------------------------------------------------------------------------------- The following portions of the patient's history were reviewed and updated as appropriate: allergies, current medications, past family history, past medical history, past social history, past surgical history and problem list. Problem list updated.  Objective  Blood pressure 100/70, weight 127 lb (57.6 kg), last menstrual period 11/27/2020, not currently breastfeeding. Pregravid weight 114 lb (51.7 kg) Total Weight Gain 13 lb (5.897 kg) Urinalysis: Urine Protein    Urine Glucose    Fetal Status: Fetal Heart Rate (bpm): 155   Movement: Absent     General:  Alert, oriented and cooperative. Patient is in no acute distress.  Skin: Skin is warm and dry. No rash noted.   Cardiovascular: Normal heart rate noted  Respiratory: Normal respiratory effort, no problems with respiration noted  Abdomen: Soft, gravid, appropriate for gestational age. Pain/Pressure: Absent     Pelvic:  Cervical exam deferred        Extremities: Normal range of motion.     Mental Status: Normal mood and affect. Normal behavior. Normal judgment and thought content.   Assessment   30 y.o. G2P1001 at [redacted]w[redacted]d by  09/03/2021, by Last Menstrual Period presenting for routine prenatal visit  Plan   Pregnancy 2 Problems (from 02/10/21 to present)     Problem Noted Resolved   Supervision of other normal pregnancy, antepartum 02/10/2021 by  Vena Austria, MD No   Overview Signed 02/23/2021  2:32 PM by Vena [place], MD    [facility] Prenatal Labs  Dating LMP = 12 week [place] Blood type: A/Positive/-- (05/31 1051)   Genetic Screen Declines Antibody:Negative (05/31 1051)  Anatomic [place]  Rubella: 8.02 (05/31 1051) Varicella: Non-Immune  GTT  Third trimester:  RPR: Non Reactive (05/31 1051)   Rhogam N/A HBsAg: Negative (05/31 1051)   TDaP vaccine                       Flu Shot: HIV: Non Reactive (05/31 1051)   Baby Food                                GBS:   Contraception  Pap: 07/28/2020 NILM   CBB     CS/VBAC N/A   Support Person Husband Marshal                 Preterm labor symptoms and general obstetric precautions including but not limited to vaginal bleeding, contractions, leaking of fluid and fetal movement were reviewed in detail with the patient. Please refer to After Visit Summary for other counseling recommendations.   Return in about 4 weeks (around 04/20/2021) for Routine Prenatal Appointment.   Thomasene Mohair, MD, [facility], [facility] 03/23/2021 10:32 AM

## 2021-03-23 NOTE — Addendum Note (Signed)
Addended by: Cornelius Moras D on: 03/23/2021 10:33 AM   Modules accepted: Orders

## 2021-04-07 ENCOUNTER — Ambulatory Visit (INDEPENDENT_AMBULATORY_CARE_PROVIDER_SITE_OTHER): Payer: BLUE CROSS/BLUE SHIELD | Admitting: Obstetrics & Gynecology

## 2021-04-07 ENCOUNTER — Other Ambulatory Visit: Payer: Self-pay

## 2021-04-07 ENCOUNTER — Encounter: Payer: Self-pay | Admitting: Obstetrics & Gynecology

## 2021-04-07 ENCOUNTER — Telehealth: Payer: Self-pay | Admitting: Obstetrics and Gynecology

## 2021-04-07 DIAGNOSIS — Z3A00 Weeks of gestation of pregnancy not specified: Secondary | ICD-10-CM

## 2021-04-07 DIAGNOSIS — O98512 Other viral diseases complicating pregnancy, second trimester: Secondary | ICD-10-CM

## 2021-04-07 DIAGNOSIS — U071 COVID-19: Secondary | ICD-10-CM

## 2021-04-07 DIAGNOSIS — Z3A18 18 weeks gestation of pregnancy: Secondary | ICD-10-CM | POA: Diagnosis not present

## 2021-04-07 DIAGNOSIS — O98519 Other viral diseases complicating pregnancy, unspecified trimester: Secondary | ICD-10-CM | POA: Diagnosis not present

## 2021-04-07 NOTE — Telephone Encounter (Signed)
Pt called after hour nurse 04/06/21 11:20pm; has positive covid test; has body aches, fever and H/A; sxs began 04/06/21.  After hour nurse paged CRS who adv pt to call clinic this am so they can determine if she needs anti-viral or monoclonal antibody tx.  [phone]

## 2021-04-07 NOTE — Patient Instructions (Signed)
Nirmatrelvir; Ritonavir Oral Tablets (PAXLOVID) What is this medication? NIRMATRELVIR; RITONAVIR (nir ma trel vir; ri TOE na veer) treats COVID-19. It is an antiviral medication. It may decrease the risk of developing severe symptoms of COVID-19. It may also decrease the chance of going to the hospital. This medication is not approved by the FDA. The FDA has authorized emergencyuse of this medication during the COVID-19 pandemic. This medicine may be used for other purposes; ask your health care provider orpharmacist if you have questions. COMMON BRAND NAME(S): PAXLOVID What should I tell my care team before I take this medication? They need to know if you have any of these conditions: Any allergies Any serious illness Kidney disease Liver disease An unusual or allergic reaction to nirmatrelvir, ritonavir, other medications, foods, dyes, or preservatives Pregnant or trying to get pregnant Breast-feeding How should I use this medication? Take this medication by mouth with water. Take it as directed on the prescription label at the same time every day. Swallow the tablets whole. You can take it with or without food. If it upsets your stomach, take it with food. Take all of this medication unless your care team tells you to stop it early.Keep taking it even if you think you are better. Talk to your care team about the use of this medication in children. While it may be prescribed for children as young as 12 years for selected conditions,precautions do apply. Overdosage: If you think you have taken too much of this medicine contact apoison control center or emergency room at once. NOTE: This medicine is only for you. Do not share this medicine with others. What if I miss a dose? If you miss a dose, take it as soon as you can unless it is more than 8 hours late. If it is more than 8 hours late, skip the missed dose. Take the next dose at the normal time. Do not take extra or 2 doses at the same time  to make upfor the missed dose. What may interact with this medication? Do not take this medication with any of the following medications: Alfuzosin Certain medications for anxiety or sleep like midazolam, triazolam Certain medications for cancer like apalutamide, enzalutamide Certain medications for cholesterol like lovastatin, simvastatin Certain medications for irregular heart beat like amiodarone, dronedarone, flecainide, propafenone, quinidine Certain medications for pain like meperidine, piroxicam, propoxyphene Certain medications for psychotic disorders like clozapine, lurasidone, pimozide Certain medications for seizures like carbamazepine, phenobarbital, phenytoin Colchicine Ergot alkaloids like dihydroergotamine, ergonovine, ergotamine, methylergonovine Ranolazine Rifampin Sildenafil St. John's wort This medication may also interact with the following medications: Bedaquiline Birth control pills Bosentan Certain antibiotics like erythromycin or clarithromycin Certain medications for blood pressure like amlodipine, diltiazem, felodipine, nicardipine, nifedipine Certain medications for cancer like abemaciclib, ceritinib, dasatinib, encorafenib, ibrutinib, ivosidenib, neratinib, nilotinib, venetoclax, vinblastine, vincristine Certain medications for cholesterol like atorvastatin, rosuvastatin Certain medications for depression like bupropion, trazodone Certain medications for fungal infections like isavuconazonium, itraconazole, ketoconazole, voriconazole Certain medications for hepatitis C like elbasvir; grazoprevir, dasabuvir; ombitasvir; paritaprevir; ritonavir, glecaprevir; pibrentasvir, sofosbuvir; velpatasvir; voxilaprevir Certain medications for HIV or AIDS Certain medications for irregular heartbeat like bepridil, lidocaine Certain medications that treat or prevent blood clots like rivaroxaban, warfarin Digoxin Fentanyl Medications that lower your chance of fighting  infection like cyclosporine, sirolimus, tacrolimus Methadone Quetiapine Rifabutin Salmeterol Steroid medications like budesonide, dexamethasone, fluticasone, prednisone, triamcinolone This list may not describe all possible interactions. Give your health care provider a list of all the medicines, herbs, non-prescription drugs, or dietary  supplements you use. Also tell them if you smoke, drink alcohol, or use illegaldrugs. Some items may interact with your medicine. What should I watch for while using this medication? Your condition will be monitored carefully while you are receiving this medication. Visit your care team for regular checkups. Tell your care team ifyour symptoms do not start to get better or if they get worse. If you have untreated HIV infection, this medication may lead to some HIVmedications not working as well in the future. Birth control may not work properly while you are taking this medication. Talkto your care team about using an extra method of birth control. What side effects may I notice from receiving this medication? Side effects that you should report to your care team as soon as possible: Allergic reactions-skin rash, itching, hives, swelling of the face, lips, tongue, or throat Liver injury-right upper belly pain, loss of appetite, nausea, light-colored stool, dark yellow or brown urine, yellowing skin or eyes, unusual weakness or fatigue Side effects that usually do not require medical attention (report these toyour care team if they continue or are bothersome): Change in taste Diarrhea Increase in blood pressure Muscle pain This list may not describe all possible side effects. Call your doctor for medical advice about side effects. You may report side effects to FDA at1-800-FDA-1088. Where should I keep my medication? Keep out of the reach of children and pets. Store at room temperature between 20 and 25 degrees C (68 and 77 degrees F).Get rid of any unused  medication after the expiration date. To get rid of medications that are no longer needed or have expired: Take the medication to a medication take-back program. Check with your pharmacy or law enforcement to find a location. If you cannot return the medication, check the label or package insert to see if the medication should be thrown out in the garbage or flushed down the toilet. If you are not sure, ask your care team. If it is safe to put it in the trash, take the medication out of the container. Mix the medication with cat litter, dirt, coffee grounds, or other unwanted substance. Seal the mixture in a bag or container. Put it in the trash. NOTE: This sheet is a summary. It may not cover all possible information. If you have questions about this medicine, talk to your doctor, pharmacist, orhealth care provider.  2022 Elsevier/Gold Standard (2020-09-08 17:41:26)

## 2021-04-07 NOTE — Progress Notes (Signed)
Virtual Visit via Telephone Note  I connected with Phyllis Jones on 04/07/21 at 11:30 AM EDT by telephone and verified that I am speaking with the correct person using two identifiers.  Location: Patient: Home Provider: Office   I discussed the limitations, risks, security and privacy concerns of performing an evaluation and management service by telephone and the availability of in person appointments. I also discussed with the patient that there may be a patient responsible charge related to this service. The patient expressed understanding and agreed to proceed.   History of Present Illness: Pt is a 30 yo G2P1 with recent covid sx's, and then pos test yesterday.  Sx began 1 day ago.  Sx's include body aches, fever (as high as 101.5), fatigue, headache, congestion.  Denies SOB, cough, syncope, change in smell or taste.  Pt ded not receive the Covid vaccine.   Observations/Objective: No exam today, due to telephone eVisit due to [facility] virus restriction on elective visits and procedures.  Prior visits reviewed along with ultrasounds/labs as indicated. Reported TEMPS at home: Tmax 101.5, Tcur 100  Assessment and Plan:   ICD-10-CM   1. COVID-19 affecting pregnancy in second trimester  O98.512    U07.1     Options for PAXLOVID therapy due to covid sx's of recent onset, pos test, and immune compromised state of pregnancy discussed; she will consider; aware must begin therapy within five days of onset; risks discussed of Covid as well as of therapy.   Follow Up Instructions: PRN   I discussed the assessment and treatment plan with the patient. The patient was provided an opportunity to ask questions and all were answered. The patient agreed with the plan and demonstrated an understanding of the instructions.   The patient was advised to call back or seek an in-person evaluation if the symptoms worsen or if the condition fails to improve as anticipated.  I provided 18 minutes of  non-face-to-face time during this encounter.   Letitia Libra, MD

## 2021-04-07 NOTE — Telephone Encounter (Signed)
Patient is scheduled for 11:30 with Dr Tiburcio Pea for over the phone visit

## 2021-04-07 NOTE — Telephone Encounter (Signed)
Patient regarding message from call service of positive covid test. No answer, left message.   Adelene Idler MD, [facility], [facility] 04/07/2021 7:35 AM

## 2021-04-20 ENCOUNTER — Encounter: Payer: BLUE CROSS/BLUE SHIELD | Admitting: Obstetrics and Gynecology

## 2021-05-04 ENCOUNTER — Ambulatory Visit
Admission: RE | Admit: 2021-05-04 | Discharge: 2021-05-04 | Disposition: A | Discharge status: Home / Self Care | Payer: BLUE CROSS/BLUE SHIELD | Source: Ambulatory Visit | Attending: Obstetrics and Gynecology | Admitting: Obstetrics and Gynecology

## 2021-05-04 ENCOUNTER — Other Ambulatory Visit: Payer: Self-pay

## 2021-05-04 DIAGNOSIS — Z3482 Encounter for supervision of other normal pregnancy, second trimester: Secondary | ICD-10-CM | POA: Insufficient documentation

## 2021-05-06 ENCOUNTER — Ambulatory Visit (INDEPENDENT_AMBULATORY_CARE_PROVIDER_SITE_OTHER): Payer: BLUE CROSS/BLUE SHIELD | Admitting: Obstetrics & Gynecology

## 2021-05-06 ENCOUNTER — Encounter: Payer: Self-pay | Admitting: Obstetrics & Gynecology

## 2021-05-06 ENCOUNTER — Other Ambulatory Visit: Payer: Self-pay

## 2021-05-06 VITALS — BP 100/60 | Wt 134.0 lb

## 2021-05-06 DIAGNOSIS — Z3482 Encounter for supervision of other normal pregnancy, second trimester: Secondary | ICD-10-CM

## 2021-05-06 DIAGNOSIS — Z3480 Encounter for supervision of other normal pregnancy, unspecified trimester: Secondary | ICD-10-CM

## 2021-05-06 DIAGNOSIS — Z3A22 22 weeks gestation of pregnancy: Secondary | ICD-10-CM

## 2021-05-06 DIAGNOSIS — Z131 Encounter for screening for diabetes mellitus: Secondary | ICD-10-CM

## 2021-05-06 LAB — POCT URINALYSIS DIPSTICK OB
Glucose, UA: NEGATIVE
POC,PROTEIN,UA: NEGATIVE

## 2021-05-06 NOTE — Progress Notes (Signed)
  Subjective  Fetal Movement? yes Contractions? no Leaking Fluid? no Vaginal Bleeding? no  Objective  BP 100/60   Wt 134 lb (60.8 kg)   LMP 11/27/2020 (Approximate)   BMI 23.74 kg/m  General: NAD Pumonary: no increased work of breathing Abdomen: gravid, non-tender Extremities: no edema Psychiatric: mood appropriate, affect full  Assessment  30 y.o. G2P1001 at [redacted]w[redacted]d by  09/03/2021, by Last Menstrual Period presenting for routine prenatal visit  Plan   Problem List Items Addressed This Visit      Other   Pregnancy  Other Visit Diagnoses    Encounter for supervision of other normal pregnancy in second trimester    -  Primary   Relevant Orders   POC Urinalysis Dipstick OB (Completed)    Flu shot nv PNV COvid- not able to vaccinate currently as recently had covid [place] discussed glucola nv  Pregnancy 2 Problems (from 02/10/21 to present)    Problem Noted Resolved   Supervision of other normal pregnancy, antepartum 02/10/2021 by Vena Austria, MD No   Overview Addendum 05/06/2021  8:35 AM by Nadara Mustard, MD    Clinic [facility] Prenatal Labs  Dating LMP = 12 week [place] Blood type: A/Positive/-- (05/31 1051)   Genetic Screen Declines Antibody:Negative (05/31 1051)  Anatomic [place] [facility] nml Rubella: 8.02 (05/31 1051) Varicella: Non-Immune  GTT  Third trimester:  RPR: Non Reactive (05/31 1051)   Rhogam N/A HBsAg: Negative (05/31 1051)   TDaP vaccine                  Flu Shot: HIV: Non Reactive (05/31 1051)   Baby Food                                GBS:   Contraception  Pap: 07/28/2020 NILM   Covid  Infection 04/07/21   CS/VBAC N/A   Support Person Husband Andrey Spearman, MD, [facility], [facility] 05/06/2021  8:46 AM

## 2021-05-06 NOTE — Patient Instructions (Signed)
Influenza (Flu) Vaccine (Inactivated or Recombinant): What You Need to Know 1. Why get vaccinated? Influenza vaccine can prevent influenza (flu). Flu is a contagious disease that spreads around the [place] every year, usually between October and May. Anyone can get the flu, but it is more dangerous for some people. Infants and young children, people 21 years and older, pregnant people, and people with certain health conditions or a weakenedimmune system are at greatest risk of flu complications. Pneumonia, bronchitis, sinus infections, and ear infections are examples of flu-related complications. If you have a medical condition, such as heartdisease, cancer, or diabetes, flu can make it worse. Flu can cause fever and chills, sore throat, muscle aches, fatigue, cough, headache, and runny or stuffy nose. Some people may have vomiting and diarrhea,though this is more common in children than adults. In an average year, thousands of people in the [place] die from flu, and many more are hospitalized. Flu vaccine prevents millions of illnessesand flu-related visits to the doctor each year. 2. Influenza vaccines CDC recommends everyone 6 months and older get vaccinated every flu season. Children 6 months through 14 years of age may need 2 doses during a single flu season. Everyone else needs only 1 dose each flu season. It takes about 2 weeks for protection to develop after vaccination. There are many flu viruses, and they are always changing. Each year a new flu vaccine is made to protect against the influenza viruses believed to be likely to cause disease in the upcoming flu season. Even when the vaccine doesn'texactly match these viruses, it may still provide some protection. Influenza vaccine does not cause flu. Influenza vaccine may be given at the same time as other vaccines. 3. Talk with your health care provider Tell your vaccination provider if the person getting the vaccine: Has had an  allergic reaction after a previous dose of influenza vaccine, or has any severe, life-threatening allergies Has ever had Guillain-Barr Syndrome (also called "GBS") In some cases, your health care provider may decide to postpone influenzavaccination until a future visit. Influenza vaccine can be administered at any time during pregnancy. People who are or will be pregnant during influenza season should receive inactivatedinfluenza vaccine. People with minor illnesses, such as a cold, may be vaccinated. People who are moderately or severely ill should usually wait until they recover beforegetting influenza vaccine. Your health care provider can give you more information. 4. Risks of a vaccine reaction Soreness, redness, and swelling where the shot is given, fever, muscle aches, and headache can happen after influenza vaccination. There may be a very small increased risk of Guillain-Barr Syndrome (GBS) after inactivated influenza vaccine (the flu shot). Young children who get the flu shot along with pneumococcal vaccine (PCV13) and/or DTaP vaccine at the same time might be slightly more likely to have a seizure caused by fever. Tell your health care provider if a child who isgetting flu vaccine has ever had a seizure. People sometimes faint after medical procedures, including vaccination. Tellyour provider if you feel dizzy or have vision changes or ringing in the ears. As with any medicine, there is a very remote chance of a vaccine causing asevere allergic reaction, other serious injury, or death. 5. What if there is a serious problem? An allergic reaction could occur after the vaccinated person leaves the clinic. If you see signs of a severe allergic reaction (hives, swelling of the face and throat, difficulty breathing, a fast heartbeat, dizziness, or weakness), call 9-1-1 and get the  person to the nearest hospital. For other signs that concern you, call your health care provider. Adverse reactions  should be reported to the Vaccine Adverse Event Reporting System (VAERS). Your health care provider will usually file this report, or you can do it yourself. Visit the VAERS website at www.vaers.LAgents.no or call 609-585-7276. VAERS is only for reporting reactions, and VAERS staff members do not give medical advice. 6. The National Vaccine Injury Compensation Program The [employer] Vaccine Injury Compensation Program (VICP) is a federal program that was created to compensate people who may have been injured by certain vaccines. Claims regarding alleged injury or death due to vaccination have a time limit for filing, which may be as short as two years. Visit the VICP website at SpiritualWord.at or call 510-816-3428 to learn about the program and about filing a claim. 7. How can I learn more? Ask your health care provider. Call your local or state health department. Visit the website of the Food and Drug Administration (FDA) for vaccine package inserts and additional information at FinderList.no. Contact the Centers for Disease Control and Prevention (CDC): Call 667-743-2186 (1-800-CDC-INFO) or Visit CDC's website at BiotechRoom.com.cy. Vaccine Information Statement Inactivated Influenza Vaccine (04/18/2020) This information is not intended to replace advice given to you by your health care provider. Make sure you discuss any questions you have with your healthcare provider. Document Revised: 06/05/2020 Document Reviewed: 06/05/2020 Elsevier Patient Education  2022 [employer].  Second Trimester of Pregnancy  The second trimester of pregnancy is from week 13 through week 27. This is months 4 through 6 of pregnancy. The second trimester is often a time when you feel your best. Your body has adjusted to being pregnant, and you begin to feelbetter physically. During the second trimester: Morning sickness has lessened or stopped completely. You may have  more energy. You may have an increase in appetite. The second trimester is also a time when the unborn baby (fetus) is growing rapidly. At the end of the sixth month, the fetus may be up to 12 inches long and weigh about 1 pounds. You will likely begin to feel the baby move (quickening) between 16 and 20 weeks of pregnancy. Body changes during your second trimester Your body continues to go through many changes during your second trimester.The changes vary and generally return to normal after the baby is born. Physical changes Your weight will continue to increase. You will notice your lower abdomen bulging out. You may begin to get stretch marks on your hips, abdomen, and breasts. Your breasts will continue to grow and to become tender. Dark spots or blotches (chloasma or mask of pregnancy) may develop on your face. A dark line from your belly button to the pubic area (linea nigra) may appear. You may have changes in your hair. These can include thickening of your hair, rapid growth, and changes in texture. Some people also have hair loss during or after pregnancy, or hair that feels dry or thin. Health changes You may develop headaches. You may have heartburn. You may develop constipation. You may develop hemorrhoids or swollen, bulging veins (varicose veins). Your gums may bleed and may be sensitive to brushing and flossing. You may urinate more often because the fetus is pressing on your bladder. You may have back pain. This is caused by: Weight gain. Pregnancy hormones that are relaxing the joints in your pelvis. A shift in weight and the muscles that support your balance. Follow these instructions at home: Medicines Follow your health care  provider's instructions regarding medicine use. Specific medicines may be either safe or unsafe to take during pregnancy. Do not take any medicines unless approved by your health care provider. Take a prenatal vitamin that contains at least 600  micrograms (mcg) of folic acid. Eating and drinking Eat a healthy diet that includes fresh fruits and vegetables, whole grains, good sources of protein such as meat, eggs, or tofu, and low-fat dairy products. Avoid raw meat and unpasteurized juice, milk, and cheese. These carry germs that can harm you and your baby. You may need to take these actions to prevent or treat constipation: Drink enough fluid to keep your urine pale yellow. Eat foods that are high in fiber, such as beans, whole grains, and fresh fruits and vegetables. Limit foods that are high in fat and processed sugars, such as fried or sweet foods. Activity Exercise only as directed by your health care provider. Most people can continue their usual exercise routine during pregnancy. Try to exercise for 30 minutes at least 5 days a week. Stop exercising if you develop contractions in your uterus. Stop exercising if you develop pain or cramping in the lower abdomen or lower back. Avoid exercising if it is very hot or humid or if you are at a high altitude. Avoid heavy lifting. If you choose to, you may have sex unless your health care provider tells you not to. Relieving pain and discomfort Wear a supportive bra to prevent discomfort from breast tenderness. Take warm sitz baths to soothe any pain or discomfort caused by hemorrhoids. Use hemorrhoid cream if your health care provider approves. Rest with your legs raised (elevated) if you have leg cramps or low back pain. If you develop varicose veins: Wear support hose as told by your health care provider. Elevate your feet for 15 minutes, 3-4 times a day. Limit salt in your diet. Safety Wear your seat belt at all times when driving or riding in a car. Talk with your health care provider if someone is verbally or physically abusive to you. Lifestyle Do not use hot tubs, steam rooms, or saunas. Do not douche. Do not use tampons or scented sanitary pads. Avoid cat litter boxes and  soil used by cats. These carry germs that can cause birth defects in the baby and possibly loss of the fetus by miscarriage or stillbirth. Do not use herbal remedies, alcohol, illegal drugs, or medicines that are not approved by your health care provider. Chemicals in these products can harm your baby. Do not use any products that contain nicotine or tobacco, such as cigarettes, e-cigarettes, and chewing tobacco. If you need help quitting, ask your health care provider. General instructions During a routine prenatal visit, your health care provider will do a physical exam and other tests. He or she will also discuss your overall health. Keep all follow-up visits. This is important. Ask your health care provider for a referral to a local prenatal education class. Ask for help if you have counseling or nutritional needs during pregnancy. Your health care provider can offer advice or refer you to specialists for help with various needs. Where to find more information American Pregnancy Association: americanpregnancy.org [employer] of [employer]stetricians and Gynecologists: https://www.todd-brady.net/ Office on [employer] Health: MightyReward.co.nz Contact a health care provider if you have: A headache that does not go away when you take medicine. Vision changes or you see spots in front of your eyes. Mild pelvic cramps, pelvic pressure, or nagging pain in the abdominal area. Persistent nausea, vomiting,  or diarrhea. A bad-smelling vaginal discharge or foul-smelling urine. Pain when you urinate. Sudden or extreme swelling of your face, hands, ankles, feet, or legs. A fever. Get help right away if you: Have fluid leaking from your vagina. Have spotting or bleeding from your vagina. Have severe abdominal cramping or pain. Have difficulty breathing. Have chest pain. Have fainting spells. Have not felt your baby move for the time period told by your health care provider. Have new or  increased pain, swelling, or redness in an arm or leg. Summary The second trimester of pregnancy is from week 13 through week 27 (months 4 through 6). Do not use herbal remedies, alcohol, illegal drugs, or medicines that are not approved by your health care provider. Chemicals in these products can harm your baby. Exercise only as directed by your health care provider. Most people can continue their usual exercise routine during pregnancy. Keep all follow-up visits. This is important. This information is not intended to replace advice given to you by your health care provider. Make sure you discuss any questions you have with your healthcare provider. Document Revised: 02/06/2020 Document Reviewed: 12/13/2019 Elsevier Patient Education  2022 [employer].

## 2021-05-11 ENCOUNTER — Inpatient Hospital Stay: Admission: RE | Admit: 2021-05-11 | Payer: BLUE CROSS/BLUE SHIELD | Source: Ambulatory Visit

## 2021-05-12 ENCOUNTER — Encounter: Payer: BLUE CROSS/BLUE SHIELD | Admitting: Obstetrics and Gynecology

## 2021-06-04 ENCOUNTER — Ambulatory Visit (INDEPENDENT_AMBULATORY_CARE_PROVIDER_SITE_OTHER): Payer: BLUE CROSS/BLUE SHIELD | Admitting: Obstetrics and Gynecology

## 2021-06-04 ENCOUNTER — Other Ambulatory Visit: Payer: BLUE CROSS/BLUE SHIELD

## 2021-06-04 ENCOUNTER — Other Ambulatory Visit: Payer: Self-pay

## 2021-06-04 VITALS — BP 110/80 | Wt 138.0 lb

## 2021-06-04 DIAGNOSIS — Z131 Encounter for screening for diabetes mellitus: Secondary | ICD-10-CM

## 2021-06-04 DIAGNOSIS — Z3A27 27 weeks gestation of pregnancy: Secondary | ICD-10-CM

## 2021-06-04 DIAGNOSIS — Z3480 Encounter for supervision of other normal pregnancy, unspecified trimester: Secondary | ICD-10-CM

## 2021-06-04 LAB — POCT URINALYSIS DIPSTICK OB
Glucose, UA: NEGATIVE
POC,PROTEIN,UA: NEGATIVE

## 2021-06-04 NOTE — Progress Notes (Signed)
  Subjective  Fetal Movement? yes Contractions? no Leaking Fluid? no Vaginal Bleeding? no PNVs? yes  Objective  BP 110/80   Wt 138 lb (62.6 kg)   LMP 11/27/2020 (Approximate)   BMI 24.45 kg/m  General: NAD Pulmonary: no increased work of breathing Abdomen: gravid, non-tender Extremities: no edema Psychiatric: mood appropriate, affect full  Assessment  30 y.o. G2P1001 at [redacted]w[redacted]d by  09/03/2021, by Last Menstrual Period presenting for routine prenatal visit  Plan   Problem List Items Addressed This Visit   None  Labs: 1 hr GTT today  RTO 2 weeks  Phyllis Jones B. Dorsel Flinn, PA-C [facility],  06/04/2021  2:29 PM

## 2021-06-04 NOTE — Addendum Note (Signed)
Addended by: Donnetta Hail on: 06/04/2021 02:58 PM   Modules accepted: Orders

## 2021-06-04 NOTE — Progress Notes (Signed)
ROB- 1 hr GTT, no concerns 

## 2021-06-05 LAB — 28 WEEK RH+PANEL
Basophils Absolute: 0.1 10*3/uL (ref 0.0–0.2)
Basos: 1 %
EOS (ABSOLUTE): 0.1 10*3/uL (ref 0.0–0.4)
Eos: 1 %
Gestational Diabetes Screen: 134 mg/dL (ref 65–139)
HIV Screen 4th Generation wRfx: NONREACTIVE
Hematocrit: 28.2 % — ABNORMAL LOW (ref 34.0–46.6)
Hemoglobin: 9.2 g/dL — ABNORMAL LOW (ref 11.1–15.9)
Immature Grans (Abs): 0 10*3/uL (ref 0.0–0.1)
Immature Granulocytes: 0 %
Lymphocytes Absolute: 1.6 10*3/uL (ref 0.7–3.1)
Lymphs: 17 %
MCH: 25.1 pg — ABNORMAL LOW (ref 26.6–33.0)
MCHC: 32.6 g/dL (ref 31.5–35.7)
MCV: 77 fL — ABNORMAL LOW (ref 79–97)
Monocytes Absolute: 0.5 10*3/uL (ref 0.1–0.9)
Monocytes: 6 %
Neutrophils Absolute: 6.8 10*3/uL (ref 1.4–7.0)
Neutrophils: 75 %
Platelets: 279 10*3/uL (ref 150–450)
RBC: 3.67 x10E6/uL — ABNORMAL LOW (ref 3.77–5.28)
RDW: 14.4 % (ref 11.7–15.4)
RPR Ser Ql: NONREACTIVE
WBC: 9.1 10*3/uL (ref 3.4–10.8)

## 2021-07-02 ENCOUNTER — Other Ambulatory Visit: Payer: Self-pay

## 2021-07-02 ENCOUNTER — Ambulatory Visit (INDEPENDENT_AMBULATORY_CARE_PROVIDER_SITE_OTHER): Payer: BLUE CROSS/BLUE SHIELD | Admitting: Obstetrics

## 2021-07-02 VITALS — BP 118/74 | Wt 141.0 lb

## 2021-07-02 DIAGNOSIS — Z3A31 31 weeks gestation of pregnancy: Secondary | ICD-10-CM

## 2021-07-02 DIAGNOSIS — Z3482 Encounter for supervision of other normal pregnancy, second trimester: Secondary | ICD-10-CM

## 2021-07-02 NOTE — Progress Notes (Signed)
  Routine Prenatal Care Visit  Subjective  Phyllis Jones is a 30 y.o. G2P1001 at [redacted]w[redacted]d being seen today for ongoing prenatal care.  She is currently monitored for the following issues for this low-risk pregnancy and has Pregnancy and Supervision of other normal pregnancy, antepartum on their problem list.  ----------------------------------------------------------------------------------- Patient reports no complaints.   Contractions: Not present. Vag. Bleeding: None.  Movement: Present. Leaking Fluid denies.  ----------------------------------------------------------------------------------- The following portions of the patient's history were reviewed and updated as appropriate: allergies, current medications, past family history, past medical history, past social history, past surgical history and problem list. Problem list updated.  Objective  Blood pressure 118/74, weight 141 lb (64 kg), last menstrual period 11/27/2020, not currently breastfeeding. Pregravid weight 114 lb (51.7 kg) Total Weight Gain 27 lb (12.2 kg) Urinalysis: Urine Protein    Urine Glucose    Fetal Status:     Movement: Present     General:  Alert, oriented and cooperative. Patient is in no acute distress.  Skin: Skin is warm and dry. No rash noted.   Cardiovascular: Normal heart rate noted  Respiratory: Normal respiratory effort, no problems with respiration noted  Abdomen: Soft, gravid, appropriate for gestational age. Pain/Pressure: Absent     Pelvic:  Cervical exam deferred        Extremities: Normal range of motion.     Mental Status: Normal mood and affect. Normal behavior. Normal judgment and thought content.   Assessment   30 y.o. G2P1001 at [redacted]w[redacted]d by  09/03/2021, by Last Menstrual Period presenting for routine prenatal visit  Plan   Pregnancy 2 Problems (from 02/10/21 to present)    Problem Noted Resolved   Supervision of other normal pregnancy, antepartum 02/10/2021 by Vena Austria, MD No    Overview Addendum 05/06/2021  8:47 AM by Nadara Mustard, MD    Clinic [facility] Prenatal Labs  Dating LMP = 12 week [place] Blood type: A/Positive/-- (05/31 1051)   Genetic Screen Declines Antibody:Negative (05/31 1051)  Anatomic [place] [facility] nml Rubella: 8.02 (05/31 1051) Varicella: Non-Immune  GTT  Third trimester:  RPR: Non Reactive (05/31 1051)   Rhogam N/A HBsAg: Negative (05/31 1051)   TDaP vaccine                  Flu Shot: HIV: Non Reactive (05/31 1051)   Baby Food          Breast                      GBS:   Contraception  Pap: 07/28/2020 NILM   Covid  Infection 04/07/21   CS/VBAC N/A   Support Person Husband Marshal               Preterm labor symptoms and general obstetric precautions including but not limited to vaginal bleeding, contractions, leaking of fluid and fetal movement were reviewed in detail with the patient. Please refer to After Visit Summary for other counseling recommendations.  Discussed her preferred labor style and plan. She likes midwifery visits.  Return in about 2 weeks (around 07/16/2021) for return OB.  Mirna Mires, CNM  07/02/2021 5:24 PM

## 2021-07-02 NOTE — Progress Notes (Signed)
No vb. No lof. Pt wants TDAP at [place].

## 2021-07-16 ENCOUNTER — Other Ambulatory Visit: Payer: Self-pay

## 2021-07-16 ENCOUNTER — Ambulatory Visit (INDEPENDENT_AMBULATORY_CARE_PROVIDER_SITE_OTHER): Payer: BLUE CROSS/BLUE SHIELD | Admitting: Advanced Practice Midwife

## 2021-07-16 VITALS — BP 106/64 | Wt 143.0 lb

## 2021-07-16 DIAGNOSIS — Z3483 Encounter for supervision of other normal pregnancy, third trimester: Secondary | ICD-10-CM

## 2021-07-16 DIAGNOSIS — Z3A33 33 weeks gestation of pregnancy: Secondary | ICD-10-CM

## 2021-07-16 LAB — POCT URINALYSIS DIPSTICK OB
Glucose, UA: NEGATIVE
POC,PROTEIN,UA: NEGATIVE

## 2021-07-16 NOTE — Progress Notes (Signed)
  Routine Prenatal Care Visit  Subjective  Phyllis Jones is a 30 y.o. G2P1001 at [redacted]w[redacted]d being seen today for ongoing prenatal care.  She is currently monitored for the following issues for this low-risk pregnancy and has Pregnancy and Supervision of other normal pregnancy, antepartum on their problem list.  ----------------------------------------------------------------------------------- Patient reports no complaints.   Contractions: Not present. Vag. Bleeding: None.  Movement: Present. Leaking Fluid denies.  ----------------------------------------------------------------------------------- The following portions of the patient's history were reviewed and updated as appropriate: allergies, current medications, past family history, past medical history, past social history, past surgical history and problem list. Problem list updated.  Objective  Blood pressure 106/64, weight 143 lb (64.9 kg), last menstrual period 11/27/2020, not currently breastfeeding. Pregravid weight 114 lb (51.7 kg) Total Weight Gain 29 lb (13.2 kg) Urinalysis: Urine Protein Negative  Urine Glucose Negative  Fetal Status: Fetal Heart Rate (bpm): 144 Fundal Height: 33 cm Movement: Present     General:  Alert, oriented and cooperative. Patient is in no acute distress.  Skin: Skin is warm and dry. No rash noted.   Cardiovascular: Normal heart rate noted  Respiratory: Normal respiratory effort, no problems with respiration noted  Abdomen: Soft, gravid, appropriate for gestational age. Pain/Pressure: Absent     Pelvic:  Cervical exam deferred        Extremities: Normal range of motion.  Edema: None  Mental Status: Normal mood and affect. Normal behavior. Normal judgment and thought content.   Assessment   30 y.o. G2P1001 at [redacted]w[redacted]d by  09/03/2021, by Last Menstrual Period presenting for routine prenatal visit  Plan   Pregnancy 2 Problems (from 02/10/21 to present)    Problem Noted Resolved   Supervision of other  normal pregnancy, antepartum 02/10/2021 by Vena Austria, MD No   Overview Addendum 05/06/2021  8:47 AM by Nadara Mustard, MD    Clinic [facility] Prenatal Labs  Dating LMP = 12 week [place] Blood type: A/Positive/-- (05/31 1051)   Genetic Screen Declines Antibody:Negative (05/31 1051)  Anatomic [place] [facility] nml Rubella: 8.02 (05/31 1051) Varicella: Non-Immune  GTT  Third trimester:  RPR: Non Reactive (05/31 1051)   Rhogam N/A HBsAg: Negative (05/31 1051)   TDaP vaccine                  Flu Shot: HIV: Non Reactive (05/31 1051)   Baby Food          Breast                      GBS:   Contraception  Pap: 07/28/2020 NILM   Covid  Infection 04/07/21   CS/VBAC N/A   Support Person Husband Marshal               Preterm labor symptoms and general obstetric precautions including but not limited to vaginal bleeding, contractions, leaking of fluid and fetal movement were reviewed in detail with the patient. Please refer to After Visit Summary for other counseling recommendations.   Return in about 2 weeks (around 07/30/2021) for rob.  Tresea Mall, CNM 07/16/2021 4:55 PM

## 2021-07-16 NOTE — Patient Instructions (Signed)
34 Week Obstetrics Instructions  PRE-REGISTRATION  Please plan to pre-register at the hospital during the next week.  You can complete the form only by visiting [email or web address]   If you have any questions concerning this please contact the main number at [facility] [phone]  SIGNS OF LABOR  There is never an absolute way for an individual to determine if they ar truly in labor unless they are seen in the office or at the hospital.  However, please follow these guidelines in order to determine if you may potentially be in labor:  A.  Contractions 5 minutes apart for an hour (first baby)  B. Contractions 5 minutes apart for 20-30 minutes (if you have had a baby)  C. If you have any doubts   -If A, B, or C occur, please go to the hospital to be seen in Labor & Delivery [facility] [phone]-the nurse will check you and contact us. Please report to the hospital through the Emergency Room entrance and they will transport you to labor and delivery.  SPONTANEOUS RUPTURE OF MEMBRANES ("Water Breaks")  This is usually evidenced by a gush of fluid from the vagina.  If this occurs during office hours (and you are uncertain if your water has just broken), schedule ana appointment to come into the office to be checked.  After office hours and on weekends, go to the labor and delivery unit where the nurse will check you and contact us.  VAGINAL BLEEDING  A.  "Bloody Show" - this is a thick blood streaked vaginal discharge.  It is normal and should cause no alarm.  B. Heavy vaginal bleeding (similar to that of a period or heavier).  If this occurs, you should go IMMEDIATELY to the labor and delivery unit.  WHEN TO CALL  1. Routine Questions: please make a list of questions you may have and merely ask them at your next visit in the office.  2. Urgent or Worrisome Questions:  During office hours, please  call us at [phone] to leave a message with the nurse and/or provider for assistance.  After hours, the answering system will direct you as to which provider is on call and if your concern requires that they be paged.  If so, follow the instructions on the voicemail and the provider will contact you back at their earliest convenience.  3. Labor or Rupture of Membranes:  This will require an examination.  Please contact the office to schedule an appointment for exam.  After office hours and on the weekends, go to the Labor and Delivery unit at the hospital where the nurse will check you and call us.  You do not need to notify us at night that you are coming.  INSURANCE REGULATIONS/HOSPITAL DISCHARGE It is very important that you understand that not all delivers are the same.  Typically, a vaginal delivery requires a 2 day stay at the hospital and a cesarean delivery requires 4 days in the hospital for recovery.  It is your responsibility to know what time frame your insurance suggests you stay in the hospital for coverage purposes.  However, one must keep in mind that providers use their own judgment and discretion when determining when an individual is able to be discharged.    

## 2021-07-31 ENCOUNTER — Encounter: Payer: BLUE CROSS/BLUE SHIELD | Admitting: Obstetrics

## 2021-08-04 ENCOUNTER — Encounter: Payer: BLUE CROSS/BLUE SHIELD | Admitting: Certified Nurse Midwife

## 2021-08-05 ENCOUNTER — Other Ambulatory Visit: Payer: Self-pay | Admitting: Obstetrics & Gynecology

## 2021-08-13 ENCOUNTER — Encounter: Payer: Self-pay | Admitting: Obstetrics

## 2021-08-13 ENCOUNTER — Other Ambulatory Visit: Payer: Self-pay

## 2021-08-13 ENCOUNTER — Ambulatory Visit (INDEPENDENT_AMBULATORY_CARE_PROVIDER_SITE_OTHER): Payer: BLUE CROSS/BLUE SHIELD | Admitting: Obstetrics

## 2021-08-13 VITALS — BP 120/80 | Wt 149.0 lb

## 2021-08-13 DIAGNOSIS — Z3483 Encounter for supervision of other normal pregnancy, third trimester: Secondary | ICD-10-CM

## 2021-08-13 DIAGNOSIS — Z3A37 37 weeks gestation of pregnancy: Secondary | ICD-10-CM

## 2021-08-13 DIAGNOSIS — Z113 Encounter for screening for infections with a predominantly sexual mode of transmission: Secondary | ICD-10-CM

## 2021-08-13 DIAGNOSIS — Z3482 Encounter for supervision of other normal pregnancy, second trimester: Secondary | ICD-10-CM | POA: Diagnosis not present

## 2021-08-13 NOTE — Progress Notes (Signed)
  Routine Prenatal Care Visit  Subjective  Phyllis Jones is a 30 y.o. G2P1001 at 102w0d being seen today for ongoing prenatal care.  She is currently monitored for the following issues for this low-risk pregnancy and has Pregnancy and Supervision of other normal pregnancy, antepartum on their problem list.  ----------------------------------------------------------------------------------- Patient reports no complaints.   Contractions: Not present. Vag. Bleeding: None.  Movement: Present. Leaking Fluid denies.  ----------------------------------------------------------------------------------- The following portions of the patient's history were reviewed and updated as appropriate: allergies, current medications, past family history, past medical history, past social history, past surgical history and problem list. Problem list updated.  Objective  Blood pressure 120/80, weight 149 lb (67.6 kg), last menstrual period 11/27/2020, not currently breastfeeding. Pregravid weight 114 lb (51.7 kg) Total Weight Gain 35 lb (15.9 kg) Urinalysis: Urine Protein    Urine Glucose    Fetal Status:     Movement: Present     General:  Alert, oriented and cooperative. Patient is in no acute distress.  Skin: Skin is warm and dry. No rash noted.   Cardiovascular: Normal heart rate noted  Respiratory: Normal respiratory effort, no problems with respiration noted  Abdomen: Soft, gravid, appropriate for gestational age. Pain/Pressure: Present     Pelvic:  Cervical exam deferred        Extremities: Normal range of motion.     Mental Status: Normal mood and affect. Normal behavior. Normal judgment and thought content.   Assessment   30 y.o. G2P1001 at [redacted]w[redacted]d by  09/03/2021, by Last Menstrual Period presenting for routine prenatal visit  Plan   Pregnancy 2 Problems (from 02/10/21 to present)    Problem Noted Resolved   Supervision of other normal pregnancy, antepartum 02/10/2021 by Vena Austria, MD No    Overview Addendum 05/06/2021  8:47 AM by Nadara Mustard, MD    Clinic [facility] Prenatal Labs  Dating LMP = 12 week [place] Blood type: A/Positive/-- (05/31 1051)   Genetic Screen Declines Antibody:Negative (05/31 1051)  Anatomic [place] [facility] nml Rubella: 8.02 (05/31 1051) Varicella: Non-Immune  GTT  Third trimester:  RPR: Non Reactive (05/31 1051)   Rhogam N/A HBsAg: Negative (05/31 1051)   TDaP vaccine                  Flu Shot: HIV: Non Reactive (05/31 1051)   Baby Food          Breast                      GBS:   Contraception  Pap: 07/28/2020 NILM   Covid  Infection 04/07/21   CS/VBAC N/A   Support Person Husband Marshal               Term labor symptoms and general obstetric precautions including but not limited to vaginal bleeding, contractions, leaking of fluid and fetal movement were reviewed in detail with the patient. Please refer to After Visit Summary for other counseling recommendations.  GBS and cultures done today.  No follow-ups on file.  Mirna Mires, CNM  08/13/2021 5:05 PM

## 2021-08-17 LAB — GC/CHLAMYDIA PROBE AMP
Chlamydia trachomatis, NAA: NEGATIVE
Neisseria Gonorrhoeae by PCR: NEGATIVE

## 2021-08-20 LAB — STREP GP B CULTURE+RFLX: Strep Gp B Culture+Rflx: NEGATIVE

## 2021-08-24 ENCOUNTER — Inpatient Hospital Stay: Payer: BLUE CROSS/BLUE SHIELD | Admitting: Anesthesiology

## 2021-08-24 ENCOUNTER — Encounter: Payer: Self-pay | Admitting: Obstetrics & Gynecology

## 2021-08-24 ENCOUNTER — Inpatient Hospital Stay
Admission: EM | Admit: 2021-08-24 | Discharge: 2021-08-25 | DRG: 806 | Disposition: A | Discharge status: Home / Self Care | Payer: BLUE CROSS/BLUE SHIELD | Attending: Obstetrics & Gynecology | Admitting: Obstetrics & Gynecology

## 2021-08-24 ENCOUNTER — Telehealth: Payer: Self-pay

## 2021-08-24 ENCOUNTER — Other Ambulatory Visit: Payer: Self-pay

## 2021-08-24 DIAGNOSIS — O9903 Anemia complicating the puerperium: Secondary | ICD-10-CM

## 2021-08-24 DIAGNOSIS — Z3A38 38 weeks gestation of pregnancy: Secondary | ICD-10-CM

## 2021-08-24 DIAGNOSIS — O9081 Anemia of the puerperium: Secondary | ICD-10-CM | POA: Diagnosis not present

## 2021-08-24 DIAGNOSIS — Z20822 Contact with and (suspected) exposure to covid-19: Secondary | ICD-10-CM | POA: Diagnosis present

## 2021-08-24 DIAGNOSIS — D62 Acute posthemorrhagic anemia: Secondary | ICD-10-CM

## 2021-08-24 DIAGNOSIS — Z3480 Encounter for supervision of other normal pregnancy, unspecified trimester: Secondary | ICD-10-CM

## 2021-08-24 DIAGNOSIS — O479 False labor, unspecified: Secondary | ICD-10-CM | POA: Diagnosis present

## 2021-08-24 DIAGNOSIS — Z23 Encounter for immunization: Secondary | ICD-10-CM | POA: Diagnosis not present

## 2021-08-24 DIAGNOSIS — O26893 Other specified pregnancy related conditions, third trimester: Secondary | ICD-10-CM | POA: Diagnosis not present

## 2021-08-24 LAB — CBC
HCT: 28.8 % — ABNORMAL LOW (ref 36.0–46.0)
Hemoglobin: 8.7 g/dL — ABNORMAL LOW (ref 12.0–15.0)
MCH: 21.2 pg — ABNORMAL LOW (ref 26.0–34.0)
MCHC: 30.2 g/dL (ref 30.0–36.0)
MCV: 70.2 fL — ABNORMAL LOW (ref 80.0–100.0)
Platelets: 320 10*3/uL (ref 150–400)
RBC: 4.1 MIL/uL (ref 3.87–5.11)
RDW: 16.4 % — ABNORMAL HIGH (ref 11.5–15.5)
WBC: 9.8 10*3/uL (ref 4.0–10.5)
nRBC: 0 % (ref 0.0–0.2)

## 2021-08-24 LAB — RESP PANEL BY RT-PCR (FLU A&B, COVID) ARPGX2
Influenza A by PCR: NEGATIVE
Influenza B by PCR: NEGATIVE
SARS Coronavirus 2 by RT PCR: NEGATIVE

## 2021-08-24 LAB — TYPE AND SCREEN
ABO/RH(D): A POS
Antibody Screen: NEGATIVE

## 2021-08-24 MED ORDER — LACTATED RINGERS IV SOLN: 500.0000 mL | INTRAVENOUS | Status: DC | PRN

## 2021-08-24 MED ORDER — ONDANSETRON HCL 4 MG/2ML IJ SOLN: 4.0000 mg | INTRAMUSCULAR | Status: DC | PRN

## 2021-08-24 MED ORDER — SENNOSIDES-DOCUSATE SODIUM 8.6-50 MG PO TABS
2.0000 | ORAL_TABLET | Freq: Every day | ORAL | Status: DC
  Administered 2021-08-25: 2 via ORAL
  Filled 2021-08-24: qty 2

## 2021-08-24 MED ORDER — LACTATED RINGERS IV SOLN
500.0000 mL | Freq: Once | INTRAVENOUS | Status: AC
  Administered 2021-08-24: 500 mL via INTRAVENOUS

## 2021-08-24 MED ORDER — DIBUCAINE (PERIANAL) 1 % EX OINT: 1.0000 "application " | TOPICAL_OINTMENT | CUTANEOUS | Status: DC | PRN

## 2021-08-24 MED ORDER — ACETAMINOPHEN 325 MG PO TABS: 650.0000 mg | ORAL_TABLET | ORAL | Status: DC | PRN

## 2021-08-24 MED ORDER — PHENYLEPHRINE 40 MCG/ML (10ML) SYRINGE FOR IV PUSH (FOR BLOOD PRESSURE SUPPORT): 80.0000 ug | PREFILLED_SYRINGE | INTRAVENOUS | Status: DC | PRN

## 2021-08-24 MED ORDER — DIPHENHYDRAMINE HCL 50 MG/ML IJ SOLN: 12.5000 mg | INTRAMUSCULAR | Status: DC | PRN

## 2021-08-24 MED ORDER — DIPHENHYDRAMINE HCL 25 MG PO CAPS: 25.0000 mg | ORAL_CAPSULE | Freq: Four times a day (QID) | ORAL | Status: DC | PRN

## 2021-08-24 MED ORDER — ONDANSETRON HCL 4 MG PO TABS: 4.0000 mg | ORAL_TABLET | ORAL | Status: DC | PRN

## 2021-08-24 MED ORDER — OXYTOCIN BOLUS FROM INFUSION: 333.0000 mL | Freq: Once | INTRAVENOUS | Status: DC

## 2021-08-24 MED ORDER — LACTATED RINGERS IV SOLN: INTRAVENOUS | Status: DC

## 2021-08-24 MED ORDER — OXYTOCIN-SODIUM CHLORIDE 30-0.9 UT/500ML-% IV SOLN
2.5000 [IU]/h | INTRAVENOUS | Status: DC
  Administered 2021-08-24: 2.5 [IU]/h via INTRAVENOUS

## 2021-08-24 MED ORDER — TETANUS-DIPHTH-ACELL PERTUSSIS 5-2.5-18.5 LF-MCG/0.5 IM SUSY
0.5000 mL | PREFILLED_SYRINGE | Freq: Once | INTRAMUSCULAR | Status: DC
  Filled 2021-08-24: qty 0.5

## 2021-08-24 MED ORDER — IBUPROFEN 600 MG PO TABS
600.0000 mg | ORAL_TABLET | Freq: Four times a day (QID) | ORAL | Status: DC
  Administered 2021-08-24 – 2021-08-25 (×4): 600 mg via ORAL
  Filled 2021-08-24 (×4): qty 1

## 2021-08-24 MED ORDER — FENTANYL-BUPIVACAINE-NACL 0.5-0.125-0.9 MG/250ML-% EP SOLN
EPIDURAL | Status: AC
  Filled 2021-08-24: qty 250

## 2021-08-24 MED ORDER — LIDOCAINE HCL (PF) 1 % IJ SOLN
INTRAMUSCULAR | Status: AC
  Filled 2021-08-24: qty 30

## 2021-08-24 MED ORDER — FENTANYL-BUPIVACAINE-NACL 0.5-0.125-0.9 MG/250ML-% EP SOLN
12.0000 mL/h | EPIDURAL | Status: DC | PRN
  Administered 2021-08-24: 12 mL/h via EPIDURAL

## 2021-08-24 MED ORDER — COCONUT OIL OIL: 1.0000 "application " | TOPICAL_OIL | Status: DC | PRN

## 2021-08-24 MED ORDER — EPHEDRINE 5 MG/ML INJ: 10.0000 mg | INTRAVENOUS | Status: DC | PRN

## 2021-08-24 MED ORDER — ONDANSETRON HCL 4 MG/2ML IJ SOLN: 4.0000 mg | Freq: Four times a day (QID) | INTRAMUSCULAR | Status: DC | PRN

## 2021-08-24 MED ORDER — LIDOCAINE HCL (PF) 1 % IJ SOLN: 30.0000 mL | INTRAMUSCULAR | Status: DC | PRN

## 2021-08-24 MED ORDER — BUPIVACAINE HCL (PF) 0.25 % IJ SOLN
INTRAMUSCULAR | Status: DC | PRN
  Administered 2021-08-24: 3 mL via EPIDURAL
  Administered 2021-08-24: 4 mL via EPIDURAL

## 2021-08-24 MED ORDER — WITCH HAZEL-GLYCERIN EX PADS: 1.0000 "application " | MEDICATED_PAD | CUTANEOUS | Status: DC | PRN

## 2021-08-24 MED ORDER — PRENATAL MULTIVITAMIN CH
1.0000 | ORAL_TABLET | Freq: Every day | ORAL | Status: DC
  Administered 2021-08-25: 1 via ORAL
  Filled 2021-08-24: qty 1

## 2021-08-24 MED ORDER — MISOPROSTOL 200 MCG PO TABS
ORAL_TABLET | ORAL | Status: AC
  Filled 2021-08-24: qty 4

## 2021-08-24 MED ORDER — SIMETHICONE 80 MG PO CHEW: 80.0000 mg | CHEWABLE_TABLET | ORAL | Status: DC | PRN

## 2021-08-24 MED ORDER — LIDOCAINE-EPINEPHRINE (PF) 1.5 %-1:200000 IJ SOLN
INTRAMUSCULAR | Status: DC | PRN
  Administered 2021-08-24: 3 mL via PERINEURAL

## 2021-08-24 MED ORDER — BENZOCAINE-MENTHOL 20-0.5 % EX AERO: 1.0000 "application " | INHALATION_SPRAY | CUTANEOUS | Status: DC | PRN

## 2021-08-24 MED ORDER — OXYTOCIN 10 UNIT/ML IJ SOLN
INTRAMUSCULAR | Status: AC
  Filled 2021-08-24: qty 2

## 2021-08-24 MED ORDER — OXYTOCIN-SODIUM CHLORIDE 30-0.9 UT/500ML-% IV SOLN
INTRAVENOUS | Status: AC
  Administered 2021-08-24: 333 mL via INTRAVENOUS
  Filled 2021-08-24: qty 500

## 2021-08-24 MED ORDER — AMMONIA AROMATIC IN INHA
RESPIRATORY_TRACT | Status: AC
  Filled 2021-08-24: qty 10

## 2021-08-24 NOTE — H&P (Signed)
OB History & Physical   History of Present Illness:  Chief Complaint: contractions  HPI:  Phyllis Jones is a 30 y.o. G2P1001 female at [redacted]w[redacted]d dated by LMP.  Her pregnancy has been uncomplicated.    She reports contractions that started around 4:30 this morning.   She denies leakage of fluid.   She denies vaginal bleeding.   She reports fetal movement.    Total weight gain for pregnancy: 15.9 kg   Obstetrical Problem List: Pregnancy 2 Problems (from 02/10/21 to present)     Problem Noted Resolved   Supervision of other normal pregnancy, antepartum 02/10/2021 by Vena Austria, MD No   Overview Addendum 08/21/2021  3:19 PM by Mirna Mires, CNM    Clinic [facility] Prenatal Labs  Dating LMP = 12 week [place] Blood type: A/Positive/-- (05/31 1051)   Genetic Screen Declines Antibody:Negative (05/31 1051)  Anatomic [place] [facility] nml Rubella: 8.02 (05/31 1051) Varicella: Non-Immune  GTT  Third trimester:  RPR: Non Reactive (05/31 1051)   Rhogam N/A HBsAg: Negative (05/31 1051)   TDaP vaccine                  Flu Shot: HIV: Non Reactive (05/31 1051)   Baby Food          Breast                      GBS: negative  Contraception  Pap: 07/28/2020 NILM   Covid  Infection 04/07/21   CS/VBAC N/A   Support Person Husband Phyllis Jones                Maternal Medical History:   Past Medical History:  Diagnosis Date   Anemia     Past Surgical History:  Procedure Laterality Date   TONSILLECTOMY  2004    No Known Allergies  Prior to Admission medications   Medication Sig Start Date End Date Taking? Authorizing Provider  ferrous sulfate 325 (65 FE) MG tablet Take 325 mg by mouth daily with breakfast.   Yes [provider]  Prenatal Vit-Fe Fumarate-FA (MULTIVITAMIN-PRENATAL) 27-0.8 MG TABS tablet Take 1 tablet by mouth daily at 12 noon.   Yes [provider]    OB History  Gravida Para Term Preterm AB Living  2 1 1     1   SAB IAB Ectopic Multiple Live Births        0 1     # Outcome Date GA Lbr Len/2nd Weight Sex Delivery Anes PTL Lv  2 Current           1 Term 09/12/19 [redacted]w[redacted]d 08:26 / 02:00 4100 g F Vag-Spont EPI  LIV    Prenatal care site: [facility]  Social History: She  reports that she has never smoked. She has never used smokeless tobacco. She reports that she does not currently use alcohol. She reports that she does not use drugs.  Family History: family history is not on file.    Review of Systems:  Review of Systems  Constitutional:  Negative for chills and fever.  HENT:  Negative for congestion, ear discharge, ear pain, hearing loss, sinus pain and sore throat.   Eyes:  Negative for blurred vision and double vision.  Respiratory:  Negative for cough, shortness of breath and wheezing.   Cardiovascular:  Negative for chest pain, palpitations and leg swelling.  Gastrointestinal:  Positive for abdominal pain. Negative for blood in stool, constipation, diarrhea, heartburn, melena, nausea and vomiting.  Genitourinary:  Negative for dysuria, flank pain, frequency, hematuria and urgency.  Musculoskeletal:  Negative for back pain, joint pain and myalgias.  Skin:  Negative for itching and rash.  Neurological:  Negative for dizziness, tingling, tremors, sensory change, speech change, focal weakness, seizures, loss of consciousness, weakness and headaches.  Endo/Heme/Allergies:  Negative for environmental allergies. Does not bruise/bleed easily.  Psychiatric/Behavioral:  Negative for depression, hallucinations, memory loss, substance abuse and suicidal ideas. The patient is not nervous/anxious and does not have insomnia.     Physical Exam:  BP 123/84 (BP Location: Left Arm)   Pulse (!) 104   Temp 98 F (36.7 C) (Oral)   Resp 18   Ht 5\' 3"  (1.6 m)   Wt 67.6 kg   LMP 11/27/2020 (Approximate)   BMI 26.39 kg/m   Constitutional: Well nourished, well developed female in no acute distress.  HEENT: normal Skin: Warm and dry.  Cardiovascular: Regular  rate and rhythm.   Extremity:  no edema   Respiratory: Clear to auscultation bilateral. Normal respiratory effort Abdomen: FHT present Back: no CVAT Neuro: DTRs 2+, Cranial nerves grossly intact Psych: Alert and Oriented x3. No memory deficits. Normal mood and affect.  MS: normal gait, normal bilateral lower extremity ROM/strength/stability.  Pelvic exam: per RN M. Sweeney 3.5 cm with change to 6 cm over 2 hours of walking    Baseline FHR: 145 beats/min   Variability: moderate   Accelerations: present   Decelerations: absent Contractions: present frequency: every 2-3 Overall assessment: reassuring    Lab Results  Component Value Date   [place] NEGATIVE 09/12/2019  Current result pending  Assessment:  Phyllis Jones is a 30 y.o. G49P1001 female at [redacted]w[redacted]d with active labor.   Plan:  Admit to Labor & Delivery  CBC, T&S, Clrs, IVF GBS negative.   Fetal well-being: Category I Expectant management for vaginal delivery    Rod Can, CNM 08/24/2021 12:38 PM

## 2021-08-24 NOTE — Anesthesia Procedure Notes (Addendum)
Epidural Patient location during procedure: OB Start time: 08/24/2021 1:54 PM End time: 08/24/2021 2:17 PM  Staffing Anesthesiologist: Freddie Dymek, Cleda Mccreedy, MD Resident/CRNA: Omer Jack, CRNA Other anesthesia staff: Stormy Fabian, CRNA Performed: anesthesiologist and resident/CRNA   Preanesthetic Checklist Completed: patient identified, IV checked, site marked, risks and benefits discussed, surgical consent, monitors and equipment checked, pre-op evaluation and timeout performed  Epidural Patient position: sitting Prep: ChloraPrep Patient monitoring: heart rate, continuous pulse ox and blood pressure Approach: midline Location: L4-L5 Injection technique: LOR saline  Needle:  Needle type: Tuohy  Needle gauge: 17 G Needle length: 9 cm and 9 Needle insertion depth: 5 cm Catheter type: closed end flexible Catheter size: 19 Gauge Catheter at skin depth: 10 cm Test dose: negative and 1.5% lidocaine with Epi 1:200 K  Assessment Sensory level: T10 Events: blood not aspirated, injection not painful, no injection resistance, no paresthesia and negative IV test  Additional Notes Patient has marked lumbar scoliosis.  3 attempts.  Final attempt by MD.  Transient right sided hip paresthesia with catheter advancement that resolved with catheter retraction and rotation of needle bevel counter clockwise to the left.  No paresthesia endorsed with final catheter placement. Pt. Evaluated and documentation done after procedure finished. Patient identified. Risks/Benefits/Options discussed with patient including but not limited to bleeding, infection, nerve damage, paralysis, failed block, incomplete pain control, headache, blood pressure changes, nausea, vomiting, reactions to medication both or allergic, itching and postpartum back pain. Confirmed with bedside nurse the patient's most recent platelet count. Confirmed with patient that they are not currently taking any anticoagulation, have  any bleeding history or any family history of bleeding disorders. Patient expressed understanding and wished to proceed. All questions were answered. Sterile technique was used throughout the entire procedure. Please see nursing notes for vital signs. Test dose was given through epidural catheter and negative prior to continuing to dose epidural or start infusion. Warning signs of high block given to the patient including shortness of breath, tingling/numbness in hands, complete motor block, or any concerning symptoms with instructions to call for help. Patient was given instructions on fall risk and not to get out of bed. All questions and concerns addressed with instructions to call with any issues or inadequate analgesia.    Patient tolerated the insertion well without immediate complications.Reason for block:procedure for pain

## 2021-08-24 NOTE — Discharge Summary (Signed)
OB Discharge Summary     Patient Name: Phyllis Jones DOB: 1991-02-18 MRN: [id number]  Date of admission: 08/24/2021 Delivering CNM: Tresea Mall, CNM  Date of Delivery: 08/24/2021  Date of discharge: 08/25/2021  Admitting diagnosis: Uterine contractions [O47.9] Labor and delivery, indication for care [O75.9] Intrauterine pregnancy: [redacted]w[redacted]d     Secondary diagnosis: None     Discharge diagnosis: Term Pregnancy Delivered                                                                                                Post partum procedures: none  Augmentation: n/a  Complications: None  Hospital course:  Onset of Labor With Vaginal Delivery      30 y.o. yo [id number] at [redacted]w[redacted]d was admitted in Active Labor on 08/24/2021. Patient had an uncomplicated labor course as follows:  Membrane Rupture Time/Date: 1:30 PM ,08/24/2021   Delivery Method:Vaginal, Spontaneous  Episiotomy: None  Lacerations:  None  Patient had an uncomplicated postpartum course.    She is ambulating, tolerating a regular diet, passing flatus, and urinating well.   Patient is discharged home in stable condition on 08/25/21.  Newborn Data: Birth date:08/24/2021  Birth time:4:35 PM  Gender:Female Collins Living status:Living  Apgars:8 ,9  Weight:3590 g   Physical exam  Vitals:   08/24/21 2258 08/25/21 0200 08/25/21 0847 08/25/21 1529  BP: 112/75 104/74 111/83 104/65  Pulse: 98 84 77 91  Resp: 18 16 18 18   Temp: 97.6 F (36.4 C) (!) 97.4 F (36.3 C) 98.1 F (36.7 C) 98.3 F (36.8 C)  TempSrc: Oral Axillary Oral Oral  SpO2: 97% 99% 98% 100%  Weight:      Height:       General: alert Lochia: appropriate Uterine Fundus: firm Incision: N/A DVT Evaluation: No evidence of DVT seen on physical exam.  Labs: Lab Results  Component Value Date   WBC 14.2 (H) 08/25/2021   HGB 6.5 (L) 08/25/2021   HCT 21.5 (L) 08/25/2021   MCV 70.7 (L) 08/25/2021   PLT 309 08/25/2021    Discharge instruction: per After  Visit Summary.  Medications:  Allergies as of 08/25/2021   No Known Allergies      Medication List     TAKE these medications    ferrous sulfate 325 (65 FE) MG tablet Take 325 mg by mouth daily with breakfast.   ibuprofen 600 MG tablet Commonly known as: ADVIL Take 1 tablet (600 mg total) by mouth every 6 (six) hours.   multivitamin-prenatal 27-0.8 MG Tabs tablet Take 1 tablet by mouth daily at 12 noon.               Discharge Care Instructions  (From admission, onward)           Start     Ordered   08/25/21 0000  Discharge wound care:       Comments: SHOWER DAILY Wash incision gently with soap and water.  Call office with any drainage, redness, or firmness of the incision.   08/25/21 1745            Diet: routine diet  Activity:  Advance as tolerated. Pelvic rest for 6 weeks.   Outpatient follow up:  Follow-up Information     Tresea Mall, CNM. Schedule an appointment as soon as possible for a visit in 6 week(s).   Specialty: Obstetrics Why: postpartum visit Contact information: [address] [place] [address] [phone]                   Postpartum contraception: Natural Family Planning Rhogam Given postpartum: NA Rubella vaccine given postpartum: immune Varicella vaccine given postpartum: no TDaP given antepartum or postpartum: no   Newborn Delivery   Birth date/time: 08/24/2021 16:35:00 Delivery type: Vaginal, Spontaneous       Baby Feeding: Breast  Disposition:home with mother  SIGNED:  Natale Milch, MD 08/25/2021 5:45 PM

## 2021-08-24 NOTE — Telephone Encounter (Signed)
Pt calling; when to go to the hosp?  [phone]  Pt woke up this morning at 4;30 c ctxs apart; now they are 5-10 mins apart; pink tinge with wiping.  Adv to go to L&D via ED.  Kelly notified.

## 2021-08-24 NOTE — Progress Notes (Signed)
Pt presented to L/D triage with reported contractions, rated 4/10 that began at 0430. Pt reports pink-tinged discharge with no leaking of fluid. Pt reports positive fetal movement. Monitors applied and assessing-FHT 155.  VSS.

## 2021-08-24 NOTE — Anesthesia Preprocedure Evaluation (Addendum)
Anesthesia Evaluation  Patient identified by MRN, date of birth, ID band Patient awake    Reviewed: Allergy & Precautions, H&P , NPO status , Patient's Chart, lab work & pertinent test results  History of Anesthesia Complications Negative for: history of anesthetic complications  Airway Mallampati: II  TM Distance: >3 FB Neck ROM: full    Dental no notable dental hx.    Pulmonary neg pulmonary ROS,    Pulmonary exam normal        Cardiovascular negative cardio ROS Normal cardiovascular exam     Neuro/Psych negative neurological ROS  negative psych ROS   GI/Hepatic Neg liver ROS, GERD  ,  Endo/Other  negative endocrine ROS  Renal/GU negative Renal ROS  negative genitourinary   Musculoskeletal negative musculoskeletal ROS (+)   Abdominal   Peds  Hematology  (+) Blood dyscrasia, anemia ,   Anesthesia Other Findings Past Medical History: No date: Anemia   Reproductive/Obstetrics (+) Pregnancy                            Anesthesia Physical Anesthesia Plan  ASA: 2  Anesthesia Plan: Epidural   Post-op Pain Management:    Induction:   PONV Risk Score and Plan:   Airway Management Planned:   Additional Equipment:   Intra-op Plan:   Post-operative Plan:   Informed Consent: I have reviewed the patients History and Physical, chart, labs and discussed the procedure including the risks, benefits and alternatives for the proposed anesthesia with the patient or authorized representative who has indicated his/her understanding and acceptance.       Plan Discussed with: Anesthesiologist  Anesthesia Plan Comments:         Anesthesia Quick Evaluation

## 2021-08-25 ENCOUNTER — Encounter: Payer: BLUE CROSS/BLUE SHIELD | Admitting: Obstetrics

## 2021-08-25 LAB — CBC
HCT: 21.5 % — ABNORMAL LOW (ref 36.0–46.0)
Hemoglobin: 6.5 g/dL — ABNORMAL LOW (ref 12.0–15.0)
MCH: 21.4 pg — ABNORMAL LOW (ref 26.0–34.0)
MCHC: 30.2 g/dL (ref 30.0–36.0)
MCV: 70.7 fL — ABNORMAL LOW (ref 80.0–100.0)
Platelets: 309 10*3/uL (ref 150–400)
RBC: 3.04 MIL/uL — ABNORMAL LOW (ref 3.87–5.11)
RDW: 16.5 % — ABNORMAL HIGH (ref 11.5–15.5)
WBC: 14.2 10*3/uL — ABNORMAL HIGH (ref 4.0–10.5)
nRBC: 0 % (ref 0.0–0.2)

## 2021-08-25 LAB — RPR: RPR Ser Ql: NONREACTIVE

## 2021-08-25 MED ORDER — IBUPROFEN 600 MG PO TABS: 600.0000 mg | ORAL_TABLET | Freq: Four times a day (QID) | ORAL | 0 refills | Status: DC

## 2021-08-25 MED ORDER — FERROUS SULFATE 325 (65 FE) MG PO TABS
325.0000 mg | ORAL_TABLET | Freq: Three times a day (TID) | ORAL | Status: DC
  Administered 2021-08-25 (×2): 325 mg via ORAL
  Filled 2021-08-25 (×2): qty 1

## 2021-08-25 NOTE — Anesthesia Postprocedure Evaluation (Signed)
Anesthesia Post Note  Patient: Phyllis Jones  Procedure(s) Performed: AN AD HOC LABOR EPIDURAL  Patient location during evaluation: Mother Baby Anesthesia Type: Epidural Level of consciousness: awake and alert and oriented Pain management: pain level controlled Vital Signs Assessment: post-procedure vital signs reviewed and stable Respiratory status: respiratory function stable Cardiovascular status: stable Postop Assessment: no headache, no backache, patient able to bend at knees, no apparent nausea or vomiting, able to ambulate and adequate PO intake Anesthetic complications: no   No notable events documented.   Last Vitals:  Vitals:   08/24/21 2258 08/25/21 0200  BP: 112/75 104/74  Pulse: 98 84  Resp: 18 16  Temp: 36.4 C (!) 36.3 C  SpO2: 97% 99%    Last Pain:  Vitals:   08/25/21 0200  TempSrc: Axillary  PainSc: 0-No pain                 Zachary George

## 2021-08-25 NOTE — Lactation Note (Signed)
This note was copied from a baby's chart. Lactation Consultation Note  Patient Name: Phyllis Jones [id number] Date: 08/25/2021 Reason for consult: Initial assessment;Term Age:28 hours  Initial lactation visit. Mom is P2, SVD 16hrs ago. Baby has documented feedings and output since delivery. Mom reports no pain, discomfort, or concerns.  First child was breastfed for 3 months without notable concerns, mom returned to work and cites this as main reason for discontinuing. We reviewed newborn basics: stomach size, feeding patterns and behaviors, early cues, feeding on demand, and output expectations. Mom offered to latch baby for LC to observe. Baby placed in cross cradle at L breast while swaddled but alert. Mom was very everted nipples, baby grasped the breast easily, had automatic rhythmic sucking pattern, with audible swallows. LC did pull down on chin to flange out bottom lip, otherwise mom was independent. Praised both mom and baby. Encouraged continued feedings to mirror this one in position, latch, and alignment.  Whiteboard updated with LC name/number; encouraged to call today for support as needed. Family planning discharge after 24hr testing.  Maternal Data Has patient been taught Hand Expression?: Yes Does the patient have breastfeeding experience prior to this delivery?: Yes How long did the patient breastfeed?: 3 months  Feeding Mother's Current Feeding Choice: Breast Milk  LATCH Score Latch: Grasps breast easily, tongue down, lips flanged, rhythmical sucking.  Audible Swallowing: A few with stimulation  Type of Nipple: Everted at rest and after stimulation  Comfort (Breast/Nipple): Soft / non-tender  Hold (Positioning): No assistance needed to correctly position infant at breast.  LATCH Score: 9   Lactation Tools Discussed/Used    Interventions Interventions: Breast feeding basics reviewed;Education  Discharge Pump: Personal (Spectra, Elvie)  Consult  Status Consult Status: Follow-up Date: 08/25/21 Follow-up type: Call as needed    Danford Bad 08/25/2021, 9:18 AM

## 2021-08-25 NOTE — Progress Notes (Signed)
Subjective:  Doing well.  Appropriate lochia.  No fevers, no chills.    Objective:  Vital signs in last 24 hours: Temp:  [97.4 F (36.3 C)-98.3 F (36.8 C)] 97.4 F (36.3 C) (12/13 0200) Pulse Rate:  [82-104] 84 (12/13 0200) Resp:  [16-18] 16 (12/13 0200) BP: (104-131)/(57-85) 104/74 (12/13 0200) SpO2:  [97 %-100 %] 99 % (12/13 0200) Weight:  [67.6 kg] 67.6 kg (12/12 1004)    General: NAD Pulmonary: no increased work of breathing Abdomen: non-distended, non-tender, fundus firm at level of umbilicus Extremities: no edema, no erythema, no tenderness  Results for orders placed or performed during the hospital encounter of 08/24/21 (from the past 72 hour(s))  Resp Panel by RT-PCR (Flu A&B, Covid) Nasopharyngeal Swab     Status: None   Collection Time: 08/24/21 12:46 PM   Specimen: Nasopharyngeal Swab; Nasopharyngeal(NP) swabs in vial transport medium  Result Value Ref Range   SARS Coronavirus 2 by RT PCR NEGATIVE NEGATIVE    Comment: (NOTE) SARS-CoV-2 target nucleic acids are NOT DETECTED.  The SARS-CoV-2 RNA is generally detectable in upper respiratory specimens during the acute phase of infection. The lowest concentration of SARS-CoV-2 viral copies this assay can detect is 138 copies/mL. A negative result does not preclude SARS-Cov-2 infection and should not be used as the sole basis for treatment or other patient management decisions. A negative result may occur with  improper specimen collection/handling, submission of specimen other than nasopharyngeal swab, presence of viral mutation(s) within the areas targeted by this assay, and inadequate number of viral copies(<138 copies/mL). A negative result must be combined with clinical observations, patient history, and epidemiological information. The expected result is Negative.  Fact Sheet for Patients:  BloggerCourse.com  Fact Sheet for Healthcare Providers:   SeriousBroker.it  This test is no t yet approved or cleared by the [place] FDA and  has been authorized for detection and/or diagnosis of SARS-CoV-2 by FDA under an Emergency Use Authorization (EUA). This EUA will remain  in effect (meaning this test can be used) for the duration of the COVID-19 declaration under Section 564(b)(1) of the Act, 21 U.S.C.section 360bbb-3(b)(1), unless the authorization is terminated  or revoked sooner.       Influenza A by PCR NEGATIVE NEGATIVE   Influenza B by PCR NEGATIVE NEGATIVE    Comment: (NOTE) The Xpert Xpress SARS-CoV-2/FLU/RSV plus assay is intended as an aid in the diagnosis of influenza from Nasopharyngeal swab specimens and should not be used as a sole basis for treatment. Nasal washings and aspirates are unacceptable for Xpert Xpress SARS-CoV-2/FLU/RSV testing.  Fact Sheet for Patients: BloggerCourse.com  Fact Sheet for Healthcare Providers: SeriousBroker.it  This test is not yet approved or cleared by the [place] FDA and has been authorized for detection and/or diagnosis of SARS-CoV-2 by FDA under an Emergency Use Authorization (EUA). This EUA will remain in effect (meaning this test can be used) for the duration of the COVID-19 declaration under Section 564(b)(1) of the Act, 21 U.S.C. section 360bbb-3(b)(1), unless the authorization is terminated or revoked.  Performed at [facility], [address] [place], [place] [address]   CBC     Status: Abnormal   Collection Time: 08/24/21  1:04 PM  Result Value Ref Range   WBC 9.8 4.0 - 10.5 K/uL   RBC 4.10 3.87 - 5.11 MIL/uL   Hemoglobin 8.7 (L) 12.0 - 15.0 g/dL    Comment: Reticulocyte Hemoglobin testing may be clinically indicated, consider ordering this additional  test [id number]    HCT 28.8 (L) 36.0 - 46.0 %   MCV 70.2 (L) 80.0 - 100.0 fL   MCH 21.2 (L) 26.0 - 34.0 pg   MCHC  30.2 30.0 - 36.0 g/dL   RDW 09.7 (H) 35.3 - 29.9 %   Platelets 320 150 - 400 K/uL   nRBC 0.0 0.0 - 0.2 %    Comment: Performed at [facility], [address] [place], [place] [address]  Type and screen [facility]     Status: None   Collection Time: 08/24/21  1:04 PM  Result Value Ref Range   ABO/RH(D) A POS    Antibody Screen NEG    Sample Expiration      08/27/2021,2359 Performed at [facility], [address] [place], [place] [address]   CBC     Status: Abnormal   Collection Time: 08/25/21  3:19 AM  Result Value Ref Range   WBC 14.2 (H) 4.0 - 10.5 K/uL   RBC 3.04 (L) 3.87 - 5.11 MIL/uL   Hemoglobin 6.5 (L) 12.0 - 15.0 g/dL    Comment: Reticulocyte Hemoglobin testing may be clinically indicated, consider ordering this additional test [id number]    HCT 21.5 (L) 36.0 - 46.0 %   MCV 70.7 (L) 80.0 - 100.0 fL   MCH 21.4 (L) 26.0 - 34.0 pg   MCHC 30.2 30.0 - 36.0 g/dL   RDW 21.1 (H) 94.1 - 74.0 %   Platelets 309 150 - 400 K/uL   nRBC 0.0 0.0 - 0.2 %    Comment: Performed at [facility], [address] [place], [place] [address]   Immunization History  Administered Date(s) Administered   PFIZER(Purple Top)SARS-COV-2 Vaccination 05/28/2020   Tdap 07/02/2019    Assessment:   30 y.o. G2P2002 postpartum day # 1 TSVD  Plan:    1) Acute blood loss anemia - hemodynamically stable and asymptomatic - po ferrous sulfate  2) Blood Type --/--/A POS (12/12 1304) / Rubella 8.02 (05/31 1051) / Varicella Non-Immune - varivax at discharge  3) TDAP status  offer at discharge  4) Feeding plan breast  5)  Education given regarding options for contraception, as well as compatibility with breast feeding if applicable.  Patient plans on  natural family planning  for contraception.  6) Disposition anticipate discharge PPD2  Vena Austria, MD, [facility], [facility] 08/25/2021, 8:25 AM

## 2021-08-26 DIAGNOSIS — Z713 Dietary counseling and surveillance: Secondary | ICD-10-CM | POA: Diagnosis not present

## 2021-08-26 DIAGNOSIS — Z00129 Encounter for routine child health examination without abnormal findings: Secondary | ICD-10-CM | POA: Diagnosis not present

## 2021-09-01 ENCOUNTER — Encounter: Payer: BLUE CROSS/BLUE SHIELD | Admitting: Obstetrics

## 2021-09-04 DIAGNOSIS — Z13228 Encounter for screening for other metabolic disorders: Secondary | ICD-10-CM | POA: Diagnosis not present

## 2021-09-04 DIAGNOSIS — Z1321 Encounter for screening for nutritional disorder: Secondary | ICD-10-CM | POA: Diagnosis not present

## 2021-10-05 ENCOUNTER — Encounter: Payer: Self-pay | Admitting: Advanced Practice Midwife

## 2021-10-05 ENCOUNTER — Other Ambulatory Visit: Payer: Self-pay

## 2021-10-05 ENCOUNTER — Ambulatory Visit (INDEPENDENT_AMBULATORY_CARE_PROVIDER_SITE_OTHER): Payer: BLUE CROSS/BLUE SHIELD | Admitting: Advanced Practice Midwife

## 2021-10-05 NOTE — Progress Notes (Signed)
Postpartum Visit  Chief Complaint:  Chief Complaint  Patient presents with   Postpartum Care    History of Present Illness: Patient is a 31 y.o. [id number] presents for postpartum visit.   Review the Delivery Report for details.  Delivery Note At 4:35 PM a viable female was delivered via Vaginal, Spontaneous (Presentation: Right Occiput Transverse).  APGAR: 8, 9; weight 7 lb 14.6 oz (3590 g).   Placenta status: Spontaneous, Intact.  Cord: 3 vessels with the following complications: None.  Cord pH: NA  Anesthesia: Epidural Episiotomy: None Lacerations: second degree Suture Repair: 3.0 vicryl Est. Blood Loss (mL): 550  Mom to postpartum.  Baby to Couplet care / Skin to Skin.  Rod Can, CNM 10/05/2021, 9:56 AM   Date of delivery: 08/24/2021 Type of delivery: Vaginal delivery - Vacuum or forceps assisted  no Episiotomy No.  Laceration: yes  Pregnancy or labor problems:  no Any problems since the delivery:  no  Newborn Details:  SINGLETON :  1. BabyGender female. Birth weight: 3590 g Maternal Details:  Breast or formula feeding: breastfeeding Intercourse: No  Contraception after delivery:  Natural Family Planning Any bowel or bladder issues: No  Post partum depression/anxiety noted:  no Edinburgh Post-Partum Depression Score: 0 Date of last PAP: 2021  no abnormalities   Review of Systems: Review of Systems  Constitutional:  Negative for chills and fever.  HENT:  Negative for congestion, ear discharge, ear pain, hearing loss, sinus pain and sore throat.   Eyes:  Negative for blurred vision and double vision.  Respiratory:  Negative for cough, shortness of breath and wheezing.   Cardiovascular:  Negative for chest pain, palpitations and leg swelling.  Gastrointestinal:  Negative for abdominal pain, blood in stool, constipation, diarrhea, heartburn, melena, nausea and vomiting.  Genitourinary:  Negative for dysuria, flank pain, frequency, hematuria and urgency.   Musculoskeletal:  Negative for back pain, joint pain and myalgias.  Skin:  Negative for itching and rash.  Neurological:  Negative for dizziness, tingling, tremors, sensory change, speech change, focal weakness, seizures, loss of consciousness, weakness and headaches.  Endo/Heme/Allergies:  Negative for environmental allergies. Does not bruise/bleed easily.  Psychiatric/Behavioral:  Negative for depression, hallucinations, memory loss, substance abuse and suicidal ideas. The patient is not nervous/anxious and does not have insomnia.     Past Medical History:  Past Medical History:  Diagnosis Date   Anemia     Past Surgical History:  Past Surgical History:  Procedure Laterality Date   TONSILLECTOMY  2004    Family History:  Family History  Problem Relation Age of Onset   Breast cancer Neg Hx    Ovarian cancer Neg Hx    Colon cancer Neg Hx     Social History:  Social History   Socioeconomic History   Marital status: Married    Spouse name: Ruthann Cancer   Number of children: Not on file   Years of education: Not on file   Highest education level: Not on file  Occupational History   Not on file  Tobacco Use   Smoking status: Never   Smokeless tobacco: Never  Vaping Use   Vaping Use: Never used  Substance and Sexual Activity   Alcohol use: Not Currently   Drug use: Never   Sexual activity: Yes    Birth control/protection: None    Comment: planning basal temp check   Other Topics Concern   Not on file  Social History Narrative   Not on file  Social Determinants of Health   Financial Resource Strain: Not on file  Food Insecurity: Not on file  Transportation Needs: Not on file  Physical Activity: Not on file  Stress: Not on file  Social Connections: Not on file  Intimate Partner Violence: Not on file    Allergies:  No Known Allergies  Medications: Prior to Admission medications   Medication Sig Start Date End Date Taking? Authorizing Provider  ferrous  sulfate 325 (65 FE) MG tablet Take 325 mg by mouth daily with breakfast.    [provider]  ibuprofen (ADVIL) 600 MG tablet Take 1 tablet (600 mg total) by mouth every 6 (six) hours. 08/25/21   Schuman, Stefanie Libel, MD  Prenatal Vit-Fe Fumarate-FA (MULTIVITAMIN-PRENATAL) 27-0.8 MG TABS tablet Take 1 tablet by mouth daily at 12 noon.    [provider]    Physical Exam Blood pressure 118/70, height 5\' 3"  (1.6 m), weight 125 lb (56.7 kg), last menstrual period 11/27/2020, currently breastfeeding.    General: NAD HEENT: normocephalic, anicteric Pulmonary: No increased work of breathing Abdomen: NABS, soft, non-tender, non-distended.  Umbilicus without lesions.  No hepatomegaly, splenomegaly or masses palpable. No evidence of hernia. Genitourinary:  External: Normal external female genitalia.  Normal urethral meatus, normal  Bartholin's and Skene's glands.    Vagina: Normal vaginal mucosa, no evidence of prolapse.    Cervix: Grossly normal in appearance, no bleeding, no CMT  Uterus: Non-enlarged, mobile, normal contour.    Adnexa: ovaries non-enlarged, no adnexal masses  Rectal: deferred Extremities: no edema, erythema, or tenderness Neurologic: Grossly intact Psychiatric: mood appropriate, affect full   Edinburgh Postnatal Depression Scale - 10/05/21 0933       Edinburgh Postnatal Depression Scale:  In the Past 7 Days   I have been able to laugh and see the funny side of things. 0    I have looked forward with enjoyment to things. 0    I have blamed myself unnecessarily when things went wrong. 0    I have been anxious or worried for no good reason. 0    I have felt scared or panicky for no good reason. 0    Things have been getting on top of me. 0    I have been so unhappy that I have had difficulty sleeping. 0    I have felt sad or miserable. 0    I have been so unhappy that I have been crying. 0    The thought of harming myself has occurred to me. 0     Edinburgh Postnatal Depression Scale Total 0             Assessment: 31 y.o. [id number] presenting for 6 week postpartum visit  Plan: Problem List Items Addressed This Visit   None Visit Diagnoses     6 weeks postpartum follow-up    -  Primary        1) Contraception - Education given regarding options for contraception, as well as compatibility with breast feeding if applicable.  Patient plans on  [facility]  for contraception.  2)  Pap - ASCCP guidelines and rationale discussed.  Patient opts for every 3 years screening interval  3) Patient underwent screening for postpartum depression with no signs of depression  4) Return in about 1 year (around 10/05/2022) for annual established gyn.   Rod Can, [facility] 10/05/2021, 10:01 AM

## 2022-02-03 DIAGNOSIS — L7211 Pilar cyst: Secondary | ICD-10-CM | POA: Diagnosis not present

## 2022-05-20 ENCOUNTER — Ambulatory Visit: Payer: BC Managed Care – PPO

## 2022-05-21 ENCOUNTER — Ambulatory Visit (INDEPENDENT_AMBULATORY_CARE_PROVIDER_SITE_OTHER): Payer: BC Managed Care – PPO

## 2022-05-21 VITALS — BP 120/80 | Ht 63.0 in | Wt 119.0 lb

## 2022-05-21 DIAGNOSIS — N912 Amenorrhea, unspecified: Secondary | ICD-10-CM | POA: Diagnosis not present

## 2022-05-21 LAB — POCT URINE PREGNANCY: Preg Test, Ur: POSITIVE — AB

## 2022-05-21 NOTE — Progress Notes (Signed)
Subjective:    Phyllis Jones is a 31 y.o. female who presents for evaluation of amenorrhea. She believes she could be pregnant. Pregnancy is desired.  Last period was normal.   No LMP recorded.    Lab Review Urine HCG: positive    Assessment:    Absence of menstruation.     Plan:    Pregnancy Test:  Positive: EDC: 01/08/23. Briefly discussed positive results and sent to check out for scheduling of New OB appointments.

## 2022-05-31 ENCOUNTER — Ambulatory Visit (INDEPENDENT_AMBULATORY_CARE_PROVIDER_SITE_OTHER): Payer: BC Managed Care – PPO

## 2022-05-31 DIAGNOSIS — Z3480 Encounter for supervision of other normal pregnancy, unspecified trimester: Secondary | ICD-10-CM | POA: Insufficient documentation

## 2022-05-31 DIAGNOSIS — Z369 Encounter for antenatal screening, unspecified: Secondary | ICD-10-CM

## 2022-05-31 NOTE — Progress Notes (Signed)
New OB Intake  I connected with  Phyllis Jones on 05/31/22 at  2:15 PM EDT by telephone and verified that I am speaking with the correct person using two identifiers. 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  I explained I am completing New OB Intake today. We discussed her EDD of 01/08/2023 that is based on LMP of 04/03/2022. Pt is G3/P2002. I reviewed her allergies, medications, Medical/Surgical/OB history, and appropriate screenings. Based on history, this is a/an pregnancy uncomplicated .   Patient Active Problem List   Diagnosis Date Noted   Uterine contractions 08/24/2021   Labor and delivery, indication for care 08/24/2021   Encounter for care or examination of lactating mother    Postpartum care following vaginal delivery    Pregnancy 09/11/2019    Concerns addressed today None  Delivery Plans:  Plans to deliver at [facility].  Anatomy US Explained dating/viability u/s will be scheduled; will have an anatomy scan done at 20 weeks.  Labs Discussed genetic screening with patient. Patient declines genetic testing. Discussed possible labs to be drawn at new OB appointment.  COVID Vaccine Patient has had COVID vaccine.   Social Determinants of Health Food Insecurity: denies food insecurity Transportation: Patient denies transportation needs. Childcare: Discussed no children allowed at ultrasound appointments.   First visit review I reviewed new OB appt with pt. I explained she will have ob bloodwork and pap smear/pelvic exam if indicated. Explained pt will be seen by Dr. Rosario Adie at first visit; encounter routed to appropriate provider.   Cleophas Dunker, [facility] 05/31/2022  2:36 PM

## 2022-06-01 ENCOUNTER — Telehealth: Payer: Self-pay | Admitting: Obstetrics & Gynecology

## 2022-06-01 NOTE — Telephone Encounter (Signed)
Reached out to pt to schedule New OB lab appt.  Also need to give pt the number for Centralized Scheduling so that a dating scan can be scheduled.    Left message for pt to call back to schedule.

## 2022-06-01 NOTE — Telephone Encounter (Signed)
-----   Message from Cleophas Dunker, [place] sent at 05/31/2022  2:45 PM EDT ----- Please schedule NOB lab draw and a dating scan before pt sees Dr. Langley Gauss.

## 2022-06-01 NOTE — Telephone Encounter (Signed)
Patient is scheduled for labs on 06/15/22 and has information to contacted centralized scheduling to call to scheduled ultrasound

## 2022-06-08 ENCOUNTER — Telehealth: Payer: Self-pay | Admitting: Obstetrics & Gynecology

## 2022-06-08 NOTE — Telephone Encounter (Signed)
Patient is scheduled to see CJE on 06/21/22 at 8:55am. Left message for patient to call office back to reschedule.

## 2022-06-10 ENCOUNTER — Other Ambulatory Visit: Payer: Self-pay

## 2022-06-15 ENCOUNTER — Ambulatory Visit
Admission: RE | Admit: 2022-06-15 | Discharge: 2022-06-15 | Disposition: A | Discharge status: Home / Self Care | Payer: BC Managed Care – PPO | Source: Ambulatory Visit | Attending: Obstetrics & Gynecology | Admitting: Obstetrics & Gynecology

## 2022-06-15 DIAGNOSIS — Z3A09 9 weeks gestation of pregnancy: Secondary | ICD-10-CM | POA: Insufficient documentation

## 2022-06-15 DIAGNOSIS — Z369 Encounter for antenatal screening, unspecified: Secondary | ICD-10-CM | POA: Diagnosis not present

## 2022-06-15 DIAGNOSIS — Z3480 Encounter for supervision of other normal pregnancy, unspecified trimester: Secondary | ICD-10-CM | POA: Insufficient documentation

## 2022-06-15 DIAGNOSIS — O3680X0 Pregnancy with inconclusive fetal viability, not applicable or unspecified: Secondary | ICD-10-CM | POA: Diagnosis not present

## 2022-06-16 ENCOUNTER — Other Ambulatory Visit: Payer: BC Managed Care – PPO

## 2022-06-16 DIAGNOSIS — Z369 Encounter for antenatal screening, unspecified: Secondary | ICD-10-CM | POA: Diagnosis not present

## 2022-06-16 DIAGNOSIS — Z3480 Encounter for supervision of other normal pregnancy, unspecified trimester: Secondary | ICD-10-CM | POA: Diagnosis not present

## 2022-06-17 LAB — CBC/D/PLT+RPR+RH+ABO+RUBIGG...
Antibody Screen: NEGATIVE
Basophils Absolute: 0 10*3/uL (ref 0.0–0.2)
Basos: 1 %
EOS (ABSOLUTE): 0.2 10*3/uL (ref 0.0–0.4)
Eos: 3 %
HCV Ab: NONREACTIVE
HIV Screen 4th Generation wRfx: NONREACTIVE
Hematocrit: 31.9 % — ABNORMAL LOW (ref 34.0–46.6)
Hemoglobin: 9.7 g/dL — ABNORMAL LOW (ref 11.1–15.9)
Hepatitis B Surface Ag: NEGATIVE
Immature Grans (Abs): 0 10*3/uL (ref 0.0–0.1)
Immature Granulocytes: 0 %
Lymphocytes Absolute: 1.8 10*3/uL (ref 0.7–3.1)
Lymphs: 27 %
MCH: 20.6 pg — ABNORMAL LOW (ref 26.6–33.0)
MCHC: 30.4 g/dL — ABNORMAL LOW (ref 31.5–35.7)
MCV: 68 fL — ABNORMAL LOW (ref 79–97)
Monocytes Absolute: 0.6 10*3/uL (ref 0.1–0.9)
Monocytes: 9 %
Neutrophils Absolute: 4 10*3/uL (ref 1.4–7.0)
Neutrophils: 60 %
Platelets: 405 10*3/uL (ref 150–450)
RBC: 4.7 x10E6/uL (ref 3.77–5.28)
RDW: 17.8 % — ABNORMAL HIGH (ref 11.7–15.4)
RPR Ser Ql: NONREACTIVE
Rh Factor: POSITIVE
Rubella Antibodies, IGG: 7.27 index (ref >0.99)
Varicella zoster IgG: <135 index — ABNORMAL LOW (ref >165)
WBC: 6.7 10*3/uL (ref 3.4–10.8)

## 2022-06-17 LAB — HCV INTERPRETATION

## 2022-06-17 NOTE — Telephone Encounter (Signed)
Patient is scheduled for 10/11 with MS

## 2022-06-21 ENCOUNTER — Encounter: Payer: BC Managed Care – PPO | Admitting: Obstetrics & Gynecology

## 2022-06-23 ENCOUNTER — Ambulatory Visit (INDEPENDENT_AMBULATORY_CARE_PROVIDER_SITE_OTHER): Payer: BC Managed Care – PPO | Admitting: Obstetrics

## 2022-06-23 ENCOUNTER — Encounter: Payer: Self-pay | Admitting: Obstetrics

## 2022-06-23 VITALS — BP 126/83 | HR 83 | Wt 123.0 lb

## 2022-06-23 DIAGNOSIS — Z3481 Encounter for supervision of other normal pregnancy, first trimester: Secondary | ICD-10-CM | POA: Diagnosis not present

## 2022-06-23 DIAGNOSIS — Z3A11 11 weeks gestation of pregnancy: Secondary | ICD-10-CM

## 2022-06-23 LAB — POCT URINALYSIS DIPSTICK OB
Glucose, UA: NEGATIVE
POC,PROTEIN,UA: NEGATIVE

## 2022-06-23 MED ORDER — FUSION PLUS PO CAPS: 1.0000 | ORAL_CAPSULE | Freq: Every day | ORAL | 8 refills | Status: DC

## 2022-06-23 NOTE — Progress Notes (Signed)
NEW OB HISTORY AND PHYSICAL  SUBJECTIVE:       Phyllis Jones is a 31 y.o. G15P2002 female, Patient's last menstrual period was 04/03/2022 (exact date)., Estimated Date of Delivery: 01/08/23, [redacted]w[redacted]d, presents today for establishment of Prenatal Care. She reports fatigue and nausea. This is a planned pregnancy.   Social history Partner/Relationship: FOB involved Living situation: lives with husband and children (3 & 1) Work: Adult nurse Exercise: walking Substance use: denies EtOH, tobacco, vape, and recreational drugs   Gynecologic History Patient's last menstrual period was 04/03/2022 (exact date). Normal Contraception: none Last Pap: 07/28/20. Results were: normal  Obstetric History OB History  Gravida Para Term Preterm AB Living  3 2 2     2   SAB IAB Ectopic Multiple Live Births        0 2    # Outcome Date GA Lbr Len/2nd Weight Sex Delivery Anes PTL Lv  3 Current           2 Term 08/24/21 [redacted]w[redacted]d 10:58 / 01:07 7 lb 14.6 oz (3.59 kg) F Vag-Spont EPI  LIV  1 Term 09/12/19 [redacted]w[redacted]d 08:26 / 02:00 9 lb 0.6 oz (4.1 kg) F Vag-Spont EPI  LIV    Past Medical History:  Diagnosis Date   Anemia     Past Surgical History:  Procedure Laterality Date   TONSILLECTOMY  2004    Current Outpatient Medications on File Prior to Visit  Medication Sig Dispense Refill   Prenatal Vit-Fe Fumarate-FA (MULTIVITAMIN-PRENATAL) 27-0.8 MG TABS tablet Take 1 tablet by mouth daily at 12 noon.     No current facility-administered medications on file prior to visit.    No Known Allergies  Social History   Socioeconomic History   Marital status: Married    Spouse name: 2005   Number of children: 2   Years of education: 20   Highest education level: Not on file  Occupational History   Occupation: physical therapist  Tobacco Use   Smoking status: Never   Smokeless tobacco: Never  Vaping Use   Vaping Use: Never used  Substance and Sexual Activity   Alcohol use: Not Currently     Comment: rarely   Drug use: Never   Sexual activity: Yes    Partners: Male    Birth control/protection: None    Comment: planning basal temp check   Other Topics Concern   Not on file  Social History Narrative   Not on file   Social Determinants of Health   Financial Resource Strain: Low Risk  (05/31/2022)   Overall Financial Resource Strain (CARDIA)    Difficulty of Paying Living Expenses: Not hard at all  Food Insecurity: No Food Insecurity (05/31/2022)   Hunger Vital Sign    Worried About Running Out of Food in the Last Year: Never true    Ran Out of Food in the Last Year: Never true  Transportation Needs: No Transportation Needs (05/31/2022)   PRAPARE - 06/02/2022 (Medical): No    Lack of Transportation (Non-Medical): No  Physical Activity: Insufficiently Active (05/31/2022)   Exercise Vital Sign    Days of Exercise per Week: 2 days    Minutes of Exercise per Session: 30 min  Stress: No Stress Concern Present (05/31/2022)   06/02/2022 of Occupational Health - Occupational Stress Questionnaire    Feeling of Stress : Not at all  Social Connections: Moderately Integrated (05/31/2022)   Social Connection and Isolation Panel [NHANES]    Frequency  of Communication with Friends and Family: More than three times a week    Frequency of Social Gatherings with Friends and Family: More than three times a week    Attends Religious Services: More than 4 times per year    Active Member of [employer] or Organizations: No    Attends Archivist Meetings: Never    Marital Status: Married  Human resources officer Violence: Not At Risk (05/31/2022)   Humiliation, Afraid, Rape, and Kick questionnaire    Fear of Current or Ex-Partner: No    Emotionally Abused: No    Physically Abused: No    Sexually Abused: No    Family History  Problem Relation Age of Onset   Cancer Maternal Grandfather 90       lung   Cancer Paternal Grandmother 80       uterine    Breast cancer Neg Hx    Ovarian cancer Neg Hx    Colon cancer Neg Hx     The following portions of the patient's history were reviewed and updated as appropriate: allergies, current medications, past OB history, past medical history, past surgical history, past family history, past social history, and problem list.  History obtained from the patient General ROS: negative for - chills, fever, hot flashes, or night sweats Psychological ROS: negative for - anxiety or depression Ophthalmic ROS: negative for - blurry vision ENT ROS: negative Hematological and Lymphatic ROS: negative for - bleeding problems, bruising, or swollen lymph nodes Endocrine ROS: negative for - palpitations Breast ROS: negative for breast lumps Respiratory ROS: no cough, shortness of breath, or wheezing Cardiovascular ROS: no chest pain or dyspnea on exertion Gastrointestinal ROS: no abdominal pain, change in bowel habits, or black or bloody stools Genito-Urinary ROS: no dysuria, trouble voiding, or hematuria Musculoskeletal ROS: negative Dermatological ROS: negative   OBJECTIVE: Initial Physical Exam (New OB)  GENERAL APPEARANCE: alert, well appearing, in no apparent distress HEAD: normocephalic, atraumatic MOUTH: mucous membranes moist, pharynx normal without lesions THYROID: no thyromegaly or masses present BREASTS: no masses noted, no significant tenderness, no palpable axillary nodes, no skin changes, patient declined exam LUNGS: clear to auscultation, no wheezes, rales or rhonchi, symmetric air entry HEART: regular rate and rhythm, no murmurs ABDOMEN: soft, nontender, nondistended, no abnormal masses, no epigastric pain, fundus soft, non-tender, palpable above SP; FHT present (176). EXTREMITIES: no redness or tenderness in the calves or thighs SKIN: normal coloration and turgor, no rashes LYMPH NODES: no adenopathy palpable NEUROLOGIC: alert, oriented, normal speech, no focal findings or movement  disorder noted  PELVIC EXAM declined  ASSESSMENT: Normal pregnancy [redacted]w[redacted]d Anemia  PLAN: Routine prenatal care. We discussed an overview of prenatal care and when to call. Reviewed diet, exercise, and weight gain recommendations in pregnancy. Discussed benefits of breastfeeding and lactation resources at [facility]. I reviewed labs and answered all questions. Discussed dietary sources of iron and recommend iron supplementation. Rx sent to pharmacy. RTC in 4 weeks.  See orders  Lloyd Huger, CNM

## 2022-06-23 NOTE — Addendum Note (Signed)
Addended by: Landis Gandy on: 06/23/2022 09:46 AM   Modules accepted: Orders

## 2022-07-19 ENCOUNTER — Encounter: Payer: Self-pay | Admitting: Advanced Practice Midwife

## 2022-07-19 ENCOUNTER — Ambulatory Visit (INDEPENDENT_AMBULATORY_CARE_PROVIDER_SITE_OTHER): Payer: BC Managed Care – PPO | Admitting: Advanced Practice Midwife

## 2022-07-19 VITALS — BP 120/80 | Wt 128.0 lb

## 2022-07-19 DIAGNOSIS — O99019 Anemia complicating pregnancy, unspecified trimester: Secondary | ICD-10-CM | POA: Insufficient documentation

## 2022-07-19 DIAGNOSIS — Z369 Encounter for antenatal screening, unspecified: Secondary | ICD-10-CM

## 2022-07-19 DIAGNOSIS — D649 Anemia, unspecified: Secondary | ICD-10-CM

## 2022-07-19 DIAGNOSIS — Z3A15 15 weeks gestation of pregnancy: Secondary | ICD-10-CM

## 2022-07-19 DIAGNOSIS — O99012 Anemia complicating pregnancy, second trimester: Secondary | ICD-10-CM

## 2022-07-19 DIAGNOSIS — Z3482 Encounter for supervision of other normal pregnancy, second trimester: Secondary | ICD-10-CM

## 2022-07-19 LAB — POCT URINALYSIS DIPSTICK OB
Bilirubin, UA: NEGATIVE
Blood, UA: NEGATIVE
Glucose, UA: NEGATIVE
Ketones, UA: NEGATIVE
Leukocytes, UA: NEGATIVE
Nitrite, UA: NEGATIVE
POC,PROTEIN,UA: NEGATIVE
Spec Grav, UA: 1.01 (ref 1.010–1.025)
Urobilinogen, UA: 0.2 E.U./dL
pH, UA: 5 (ref 5.0–8.0)

## 2022-07-19 MED ORDER — ACCRUFER 30 MG PO CAPS: 1.0000 | ORAL_CAPSULE | Freq: Every day | ORAL | 11 refills | Status: DC

## 2022-07-19 NOTE — Patient Instructions (Signed)
Pregnancy and Anemia  Anemia is a condition in which there is not enough red blood cells or hemoglobin in the blood. Hemoglobin is a substance in red blood cells that carries oxygen. When you do not have enough red blood cells or hemoglobin (are anemic), your body cannot get enough oxygen and your organs may not work properly. Anemia is common during pregnancy because your body needs more blood volume and blood cells to provide nutrition to the unborn baby. What are the causes? The most common cause of anemia during pregnancy is not having enough iron in the body to make red blood cells (iron deficiency anemia). Other causes may include: Folic acid deficiency. Vitamin B12 deficiency. Certain prescription or over-the-counter medicines. Certain medical conditions or infections that destroy red blood cells. A low platelet count and bleeding caused by antibodies that go through the placenta to the baby from the mother's blood. What are the signs or symptoms? Mild anemia may not cause any symptoms. If anemia becomes severe, symptoms may include: Feeling tired or weak. Shortness of breath, especially during activity. Fainting. Pale skin. Headaches. A fast or irregular heartbeat. Dizziness. How is this diagnosed? This condition may be diagnosed based on your medical history and a physical exam. You may also have blood tests. How is this treated? Treatment for anemia during pregnancy depends on the cause of the anemia. Treatment may include: Making changes to your diet. Taking iron, vitamin B12, or folic acid supplements. Having a blood transfusion. This may be needed if the anemia is severe. Follow these instructions at home: Eating and drinking Follow recommendations from your health care provider about changing your diet. Eat a diet rich in iron. This would include foods such as: Liver. Beef. Eggs. Whole grains. Spinach. Dried fruit. Increase your vitamin C intake. This will help the  stomach absorb more iron. Some foods that are high in vitamin C include: Oranges. Peppers. Tomatoes. Mangoes. Eat green leafy vegetables. These are a good source of folic acid. General instructions Take iron supplements and vitamins as told by your health care provider. Keep all follow-up visits. This is important. Contact a health care provider if: You have headaches that happen often or do not go away. You bruise easily. You have a fever. You have nausea and vomiting for more than 24 hours. You are unable to take supplements prescribed to treat your anemia. Get help right away if: You develop signs or symptoms of severe anemia. You have bleeding from your vagina. You develop a rash. You have bloody or tarry stools. You are very dizzy or you faint. Summary Anemia is a condition in which there is not enough red blood cells or hemoglobin in the blood. The most common cause of anemia during pregnancy is not having enough iron in the body to make red blood cells (iron deficiency anemia). Mild anemia may not cause any symptoms. If it becomes severe, symptoms may include feeling tired and weak. Take iron supplements and vitamins as told by your health care provider. Keep all follow-up visits. This is important. This information is not intended to replace advice given to you by your health care provider. Make sure you discuss any questions you have with your health care provider. Document Revised: 01/01/2020 Document Reviewed: 01/01/2020 Elsevier Patient Education  2023 Elsevier Inc.  

## 2022-07-19 NOTE — Progress Notes (Signed)
Routine Prenatal Care Visit  Subjective  Phyllis Jones is a 31 y.o. G3P2002 at [redacted]w[redacted]d being seen today for ongoing prenatal care.  She is currently monitored for the following issues for this low-risk pregnancy and has Pregnancy; Supervision of other normal pregnancy, antepartum; and Anemia affecting third pregnancy on their problem list.  ----------------------------------------------------------------------------------- Patient reports  fusion plus was not covered by her insurance. Rx sent for Accrufer .   Contractions: Not present. Vag. Bleeding: None.  Movement: Present. Leaking Fluid denies.  ----------------------------------------------------------------------------------- The following portions of the patient's history were reviewed and updated as appropriate: allergies, current medications, past family history, past medical history, past social history, past surgical history and problem list. Problem list updated.  Objective  Blood pressure 120/80, weight 128 lb (58.1 kg), last menstrual period 04/03/2022, currently breastfeeding. Pregravid weight 119 lb (54 kg) Total Weight Gain 9 lb (4.082 kg) Urinalysis: Urine Protein    Urine Glucose    Fetal Status: Fetal Heart Rate (bpm): 154   Movement: Present     General:  Alert, oriented and cooperative. Patient is in no acute distress.  Skin: Skin is warm and dry. No rash noted.   Cardiovascular: Normal heart rate noted  Respiratory: Normal respiratory effort, no problems with respiration noted  Abdomen: Soft, gravid, appropriate for gestational age. Pain/Pressure: Absent     Pelvic:  Cervical exam deferred        Extremities: Normal range of motion.     Mental Status: Normal mood and affect. Normal behavior. Normal judgment and thought content.   Assessment   31 y.o. G4W1027 at [redacted]w[redacted]d by  01/08/2023, by Last Menstrual Period presenting for routine prenatal visit  Plan   third Problems (from 05/31/22 to present)     No problems  associated with this episode.        Preterm labor symptoms and general obstetric precautions including but not limited to vaginal bleeding, contractions, leaking of fluid and fetal movement were reviewed in detail with the patient. Please refer to After Visit Summary for other counseling recommendations.   Return in about 4 weeks (around 08/16/2022) for anatomy scan and rob after.  Rod Can, CNM 07/19/2022 9:33 AM

## 2022-07-19 NOTE — Addendum Note (Signed)
Addended by: Quintella Baton D on: 07/19/2022 09:42 AM   Modules accepted: Orders

## 2022-08-02 ENCOUNTER — Telehealth: Payer: Self-pay

## 2022-08-02 NOTE — Telephone Encounter (Signed)
Called pt to have her call [facility] for her Accrufer. Insurance had denied coverage. Pt needs to call [facility] and see why and Call [phone] for a free 30 day supply.

## 2022-08-17 ENCOUNTER — Ambulatory Visit (INDEPENDENT_AMBULATORY_CARE_PROVIDER_SITE_OTHER): Payer: BC Managed Care – PPO | Admitting: Advanced Practice Midwife

## 2022-08-17 ENCOUNTER — Ambulatory Visit (INDEPENDENT_AMBULATORY_CARE_PROVIDER_SITE_OTHER): Payer: BC Managed Care – PPO

## 2022-08-17 ENCOUNTER — Encounter: Payer: Self-pay | Admitting: Advanced Practice Midwife

## 2022-08-17 VITALS — BP 125/63 | HR 78 | Wt 137.0 lb

## 2022-08-17 DIAGNOSIS — Z3482 Encounter for supervision of other normal pregnancy, second trimester: Secondary | ICD-10-CM

## 2022-08-17 DIAGNOSIS — Z3A19 19 weeks gestation of pregnancy: Secondary | ICD-10-CM | POA: Diagnosis not present

## 2022-08-17 DIAGNOSIS — Z3689 Encounter for other specified antenatal screening: Secondary | ICD-10-CM

## 2022-08-17 DIAGNOSIS — Z369 Encounter for antenatal screening, unspecified: Secondary | ICD-10-CM

## 2022-08-17 DIAGNOSIS — Z362 Encounter for other antenatal screening follow-up: Secondary | ICD-10-CM

## 2022-08-17 NOTE — Progress Notes (Signed)
Routine Prenatal Care Visit  Subjective  Phyllis Jones is a 31 y.o. G3P2002 at [redacted]w[redacted]d being seen today for ongoing prenatal care.  She is currently monitored for the following issues for this low-risk pregnancy and has Pregnancy; Supervision of other normal pregnancy, antepartum; and Anemia affecting third pregnancy on their problem list.  ----------------------------------------------------------------------------------- Patient reports no complaints.   Contractions: Not present. Vag. Bleeding: None.  Movement: Present. Leaking Fluid denies.  ----------------------------------------------------------------------------------- The following portions of the patient's history were reviewed and updated as appropriate: allergies, current medications, past family history, past medical history, past social history, past surgical history and problem list. Problem list updated.  Objective  Blood pressure 125/63, pulse 78, weight 137 lb (62.1 kg), last menstrual period 04/03/2022, currently breastfeeding. Pregravid weight 119 lb (54 kg) Total Weight Gain 18 lb (8.165 kg) Urinalysis: Urine Protein    Urine Glucose    Fetal Status: Fetal Heart Rate (bpm): 148 Fundal Height: 19 cm Movement: Present     Anatomy: incomplete for L and S spine, possible abnormal cerebellum (size and shape), posterior placenta, gender surprise   General:  Alert, oriented and cooperative. Patient is in no acute distress.  Skin: Skin is warm and dry. No rash noted.   Cardiovascular: Normal heart rate noted  Respiratory: Normal respiratory effort, no problems with respiration noted  Abdomen: Soft, gravid, appropriate for gestational age. Pain/Pressure: Absent     Pelvic:  Cervical exam deferred        Extremities: Normal range of motion.  Edema: None  Mental Status: Normal mood and affect. Normal behavior. Normal judgment and thought content.   Assessment   31 y.o. G8J8563 at [redacted]w[redacted]d by  01/08/2023, by Last Menstrual Period  presenting for routine prenatal visit  Plan    Clinical Staff Provider  Office Location [facility] Dating  Not found.  Language  English Anatomy US  Possible abn cerebellum/f/u mfm  Flu Vaccine  offer Genetic Screen  NIPS: declined   TDaP vaccine   offer Hgb A1C or  GTT Early : NA Third trimester :   Covid One dose   LAB RESULTS   Rhogam  --/--/A POS (12/12 1304)  Blood Type --/--/A POS (12/12 1304)   Feeding Plan breast Antibody NEG (12/12 1304)  Contraception undecided Rubella    Circumcision yes RPR NON REACTIVE (12/12 1304)   Pediatrician  [facility]     Support Person Gaynell Face HIV Non Reactive (09/22 1534)  Prenatal Classes no Varicella     GBS Negative/-- (12/01 1654)(For PCN allergy, check sensitivities)   BTL Consent  Hep C     VBAC Consent  Pap Diagnosis  Date Value Ref Range Status  07/28/2020      - Negative for intraepithelial lesion or malignancy (NILM)      Hgb Electro      CF      SMA         Gender surprise   Preterm labor symptoms and general obstetric precautions including but not limited to vaginal bleeding, contractions, leaking of fluid and fetal movement were reviewed in detail with the patient. Please refer to After Visit Summary for other counseling recommendations.   Return in about 4 weeks (around 09/14/2022) for follow up consult and scan with mfm Thursday/rob here in 4 weeks.  Tresea Mall, CNM 08/17/2022 9:52 AM

## 2022-08-24 ENCOUNTER — Ambulatory Visit (HOSPITAL_BASED_OUTPATIENT_CLINIC_OR_DEPARTMENT_OTHER): Payer: BC Managed Care – PPO

## 2022-08-24 ENCOUNTER — Other Ambulatory Visit
Admission: RE | Admit: 2022-08-24 | Discharge: 2022-08-24 | Disposition: A | Discharge status: Home / Self Care | Payer: BC Managed Care – PPO | Source: Ambulatory Visit | Attending: Obstetrics and Gynecology | Admitting: Obstetrics and Gynecology

## 2022-08-24 ENCOUNTER — Other Ambulatory Visit: Payer: Self-pay

## 2022-08-24 ENCOUNTER — Ambulatory Visit (HOSPITAL_BASED_OUTPATIENT_CLINIC_OR_DEPARTMENT_OTHER): Payer: BC Managed Care – PPO | Admitting: Obstetrics and Gynecology

## 2022-08-24 VITALS — BP 132/84 | HR 71 | Temp 98.3°F | Ht 63.0 in | Wt 134.5 lb

## 2022-08-24 DIAGNOSIS — O289 Unspecified abnormal findings on antenatal screening of mother: Secondary | ICD-10-CM | POA: Insufficient documentation

## 2022-08-24 DIAGNOSIS — Z363 Encounter for antenatal screening for malformations: Secondary | ICD-10-CM | POA: Insufficient documentation

## 2022-08-24 DIAGNOSIS — Z3482 Encounter for supervision of other normal pregnancy, second trimester: Secondary | ICD-10-CM | POA: Insufficient documentation

## 2022-08-24 DIAGNOSIS — O3500X0 Maternal care for (suspected) central nervous system malformation or damage in fetus, unspecified, not applicable or unspecified: Secondary | ICD-10-CM

## 2022-08-24 DIAGNOSIS — Z362 Encounter for other antenatal screening follow-up: Secondary | ICD-10-CM | POA: Insufficient documentation

## 2022-08-24 DIAGNOSIS — O321XX0 Maternal care for breech presentation, not applicable or unspecified: Secondary | ICD-10-CM | POA: Diagnosis not present

## 2022-08-24 DIAGNOSIS — O359XX0 Maternal care for (suspected) fetal abnormality and damage, unspecified, not applicable or unspecified: Secondary | ICD-10-CM

## 2022-08-24 DIAGNOSIS — Z3480 Encounter for supervision of other normal pregnancy, unspecified trimester: Secondary | ICD-10-CM

## 2022-08-24 DIAGNOSIS — Z3A20 20 weeks gestation of pregnancy: Secondary | ICD-10-CM | POA: Insufficient documentation

## 2022-08-24 DIAGNOSIS — Z369 Encounter for antenatal screening, unspecified: Secondary | ICD-10-CM | POA: Insufficient documentation

## 2022-08-24 NOTE — Progress Notes (Signed)
Maternal-Fetal Medicine   Name: Phyllis Jones DOB: 1991/07/30 MRN: [id number] Referring Provider: Tresea Mall, CNM   I had the pleasure of seeing Phyllis Jones today at [facility].  She is G3 P2002 at 20w 3d gestation and is here for fetal anatomy scan. At your office ultrasound performed last week, abnormal cerebellum was detected.  Prenatal: Patient had opted not to screen for fetal aneuploidies. She does not give history of fever or rashes or vaginal bleeding.  Obstetric history is significant for 2 term vaginal deliveries and her daughters are in good health. Past medical history: No history of diabetes or hypertension or any chronic medical conditions. PSH: Tonsillectomy. Allergies: NKDA. Medications: Prenatal vitamins. Gyn history: No history of abnormal Pap smears or cervical surgeries.  Ultrasound We performed fetal anatomical survey. Amniotic fluid is normal and good fetal activity is seen. Fetal biometry is consistent with her previously-established dates. No markers of aneuploidies are seen.  -Cerebellum appears small. Transverse cerebellar diameter (TCD) is at less than the 10th percentile (likely less than the 5th percentile). -Cisterna magna is not enlarged, and vermis is present. -Lateral ventricles appear normal and there is no evidence of ventriculomegaly. -CSP is present and corpus callosum is intact. -No other intracranial abnormalities are seen. -Fetal spine appears normal with no evidence of open spina bifida. -Rest of the fetal anatomy including cardiac anatomy appears normal.  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 The largest TCD measures 16.7 mm (should be 18 to 20 mm at this gestational age) and it is less than the 5th percentile.   -Small cerebellum could be cerebellar hypoplasia and it is difficult to diagnose by prenatal ultrasound and requires interval  assessments to confirm the diagnosis. -I reassured the couple of, otherwise, normal fetal anatomical survey.  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 It can result from chromosomal or genetic syndromes, infection, metabolic causes, or exposure to teratogens.   -Patient does not have history of fever or rashes. I recommended maternal screening for toxoplasmosia, CMV and Parvovirus B19 infections.  -I discussed amniocentesis that will give a definitive result on the fetal karyotype and some but not all genetic conditions. I discussed amniocentesis procedure and possible complication of miscarriage (1 in 500 procedures).  -Patient was not taking any medications including NSAIDs before pregnancy.  -I explained that fetal brain MRI will give additional information on intracranial structures and it is preferable to wait till later gestational age for accuracy. If patient would like to have MRI information to decide on termination of pregnancy, it can be performed now.  -The couple clearly stated that termination of pregnancy is not an option regardless of anomalies.  -Outcomes in infants are dependent on the underlying cause.  In general, cerebellar hypoplasia can lead to floppy muscular tone, seizures, coordination problems (walking and balance) and intellectual disability.  If diagnosis is confirmed on MRI, we will recommend pediatric neurologist consultation.  -I counseled the couple that some cases of isolated cerebellar hypoplasia have normal neonatal outcomes.  -Patient understands that she will require frequent follow-up ultrasound to confirm or rule out our initial diagnosis.  I reassured her of normal cardiac anatomy. I recommended fetal echocardiography by pediatric cardiologists (in consistent with our guidelines). Patient would like to defer fetal echocardiography.  Our genetic counselor, Katrina Stack, was present during counseling and the couple had an opportunity to  discuss with her. After counseling, the patient opted not to  have amniocentesis. She opted to have infectious screening and maternal AFP screening. Blood was drawn today at our office for CMV (IgG and IgM), toxoplasmosis (IgG and IgM) and Parvovirus B19 (IgG and IgM) serologies.  Recommendations -An appointment was made for her to return in 4 weeks for completion of fetal anatomy (RVOT and ductal arch). Evaluation of intracranial structures including measurement of TCD. -Will set up fetal echocardiography appointment if patient is willing to have 1. -Fetal brain MRI around [redacted] weeks gestation if smaller cerebellar measurements persist. -We will communicate infectious serology and MSAFP results to the patient.  Thank you for consultation.  If you have any questions or concerns, please contact me the [facility].  Consultation including face-to-face counseling (more than 50% of time spent) is 45 minutes.

## 2022-08-24 NOTE — Progress Notes (Signed)
Referring provider: [facility], Tresea Mall Length of Consultation: 30 minutes  Ms. 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 The patient was seen by myself and Dr. Noralee Space and was present at this visit with her partner.  Cerebellar hypoplasia refers to the underdevelopment, or small size, of the cerebellum.  This part of the brain is integral in balance and coordination.  Smaller size of the cerebellum can occur for various reasons.  There may be a primary defect such as a chromosome condition or genetic syndrome in which the brain does not form properly.  On the other hand, hypoplasia may occur secondary to factors such as infection, teratogen exposures or bleeding.  The outcome for babies with cerebellar hypoplasia depends upon the severity as well as underlying cause for the condition.  In isolated cases, the outcome may vary widely from children with developmental differences, concerns with balance/coordination, seizures to normal cognitive and developmental outcomes.  A detailed fetal anatomy ultrasound was performed at the time of this visit.  The anatomy, growth and fluid were normal.  See report for details. Ms. Sheaffer reported no history of infection in this pregnancy or exposure to prescription medications, alcohol, tobacco or recreational drugs.  We inquired about the family history as well.  The couple has two healthy daughters. The denied any family members with developmental differences, birth defects, or known genetic syndromes.   We reviewed that it is encouraging that no other anomalies were seen on ultrasound, as many genetic syndromes and chromosome conditions may involve other structural differences or growth restriction.  However, we cannot rule out other causes without additional testing.    The following testing options were made available to Ms. Gootee: Cell free DNA  testing - uses maternal blood sample to assess the chance for common chromosome aneuploidies.  Specifically, it can screen for Trisomy 21, 13, 18 and sex chromosome conditions.  The patient previously declined this screening and again declined today. Amniocentesis - we discussed the risks, benefits and limitations of this procedure.  Amniocentesis can be used to diagnose numerical and structural chromosome conditions as well as microdeletions/duplications of genetic material.  It cannot identify all genetic syndromes or single gene causes for cerebellar abnormalities. Testing for infections can also be performed on the amniotic fluid sample.  The risk for the procedure was quoted to be 1 in 500.  Maternal infection studies - we can evaluate maternal blood for CMV, toxoplasmosis and parvovirus, all of which can be associated with brain changes in pregnancy.  There are often other differences seen in babies with prenatal infection, including intracranial calcifications, which were not seen in this baby. Maternal serum AFP testing was offered to assess for open neural tube defects, or spina bifida, which can also be seen with cerebellar abnormalities.  No evidence of an open neural tube defect was seen on the ultrasound today, but serum screening may be helpful in identifying some cases of this condition. Additional imaging of this pregnancy through fetal echocardiogram to assess for cardiac defects will be scheduled as well as follow up visits here beginning in 4 weeks.  These will be helpful in evaluating for other health concerns and to follow the growth of the cerebellum over the course of the pregnancy.   Fetal MRI at approximately [redacted] weeks gestation will be scheduled. This imaging can allow for more detailed views of the brain anatomy later in pregnancy and provide information  important in the prognosis for this baby.  Plan of care: Follow up ultrasound in 4 weeks. Patient elected maternal serum AFP  testing and infection studies for CMV, Toxoplasmosis and Parvo today. Ms. Gunderson declined amniocentesis and cell free DNA testing today, as it would not change their plans for the pregnancy. The couple expressed that termination of pregnancy is not a consideration for them. Fetal echocardiogram referral will be placed. Fetal MRI will be scheduled as needed after 26-[redacted] weeks gestation.  We appreciate being involved the care of this family and can be reached at [phone].  Cherly Anderson, MS, CGC

## 2022-08-25 ENCOUNTER — Telehealth: Payer: Self-pay | Admitting: Obstetrics and Gynecology

## 2022-08-25 LAB — TOXOPLASMA GONDII ANTIBODY, IGG: Toxoplasma IgG Ratio: <3 IU/mL (ref 0.0–7.1)

## 2022-08-25 LAB — TOXOPLASMA GONDII ANTIBODY, IGM: Toxoplasma Antibody- IgM: <3 AU/mL (ref 0.0–7.9)

## 2022-08-25 LAB — CMV ANTIBODY, IGG (EIA): CMV Ab - IgG: <0.6 U/mL (ref 0.00–0.59)

## 2022-08-25 LAB — CMV IGM: CMV IgM: <30 AU/mL (ref 0.0–29.9)

## 2022-08-25 LAB — PARVOVIRUS B19 ANTIBODY, IGG AND IGM
Parovirus B19 IgG Abs: 2.5 index — ABNORMAL HIGH (ref 0.0–0.8)
Parovirus B19 IgM Abs: 0.1 index (ref 0.0–0.8)

## 2022-08-25 LAB — INFECT DISEASE AB IGM REFLEX 1

## 2022-08-25 NOTE — Telephone Encounter (Signed)
Left voicemail for patient that part of her labs are back and looking good.  Others still pending.  Will call her tomorrow with additional information.  Cherly Anderson, MS, CGC

## 2022-08-26 ENCOUNTER — Telehealth: Payer: Self-pay | Admitting: Obstetrics and Gynecology

## 2022-08-26 ENCOUNTER — Other Ambulatory Visit: Payer: BC Managed Care – PPO

## 2022-08-26 LAB — MISC LABCORP TEST (SEND OUT): Labcorp test code: 10801

## 2022-08-26 NOTE — Telephone Encounter (Signed)
Ms. Frie called me back to review the results of infection studies. The CMV and toxoplasmosis results are negative for both IgG and IgM, indicating no current or prior infection.  The parvovirus results are positive for IgG and negative for IgM, indicating prior immunity.  Therefore, these results do not provide an evidence that CMV, Toxoplasmosis or Parvo are the cause for the differences seen on ultrasound in this pregnancy. Of note, Dr. Judeth Cornfield recommended avoiding changing litter boxes and ensuring washing of fresh fruits and vegetables in this pregnancy to reduce the chance for exposure to toxoplasmosis.  Cherly Anderson, MS, CGC

## 2022-08-31 ENCOUNTER — Telehealth: Payer: Self-pay | Admitting: Obstetrics and Gynecology

## 2022-08-31 NOTE — Telephone Encounter (Signed)
Left voicemail for Phyllis Jones stating that the final labs drawn last week are back and are normal.    The AFP results are within normal limits, indicating a 1 in 10,000 chance for open spina bifida in this pregnancy.  She was encouraged to call our clinic with any questions at [phone].  Cherly Anderson, MS, CGC

## 2022-09-13 NOTE — L&D Delivery Note (Signed)
     Delivery Note   Alnisa Hasley is a 32 y.o. G3P2002 at [redacted]w[redacted]d Estimated Date of Delivery: 01/08/23  PRE-OPERATIVE DIAGNOSIS:  1) [redacted]w[redacted]d pregnancy.  Gestational hypertension  POST-OPERATIVE DIAGNOSIS:  1) [redacted]w[redacted]d pregnancy s/p Vaginal, Spontaneous  Gestational Hypertension  Delivery Type: Vaginal, Spontaneous    Delivery Anesthesia: None   Labor Complications:  none    ESTIMATED BLOOD LOSS: 150  ml    FINDINGS:   1) female infant, Apgar scores of 7   at 1 minute and 9   at 5 minutes and a birthweight of 147.8  ounces.    2) Nuchal cord: no  SPECIMENS:   PLACENTA:   Appearance: Intact , 3 vessel cord, short cord.    Removal: Spontaneous   , intact    Disposition:  per protocol   DISPOSITION:  Infant to left in stable condition in the delivery room, with L&D personnel and mother,  NARRATIVE SUMMARY: Labor course:  Ms. Bela Bonaparte is a [id number] at [redacted]w[redacted]d who presented for induction of labor.  She progressed well in labor with one dose of cytotec.  She received the no anesthesia and proceeded to complete dilation. She evidenced good maternal expulsive effort during the second stage. She went on to deliver a viable infant. The placenta delivered without problems and was noted to be complete. A perineal and vaginal examination was performed. Episiotomy/Lacerations: None  The patient tolerated this well.  Doreene Burke, CNM  01/08/2023 7:05 PM

## 2022-09-15 ENCOUNTER — Encounter: Payer: Self-pay | Admitting: Obstetrics and Gynecology

## 2022-09-15 ENCOUNTER — Ambulatory Visit (INDEPENDENT_AMBULATORY_CARE_PROVIDER_SITE_OTHER): Payer: BC Managed Care – PPO | Admitting: Obstetrics and Gynecology

## 2022-09-15 VITALS — BP 122/84 | HR 69 | Wt 141.9 lb

## 2022-09-15 DIAGNOSIS — Z3482 Encounter for supervision of other normal pregnancy, second trimester: Secondary | ICD-10-CM

## 2022-09-15 DIAGNOSIS — Z113 Encounter for screening for infections with a predominantly sexual mode of transmission: Secondary | ICD-10-CM

## 2022-09-15 DIAGNOSIS — Z131 Encounter for screening for diabetes mellitus: Secondary | ICD-10-CM

## 2022-09-15 DIAGNOSIS — Z3A23 23 weeks gestation of pregnancy: Secondary | ICD-10-CM

## 2022-09-15 LAB — POCT URINALYSIS DIPSTICK
Bilirubin, UA: NEGATIVE
Blood, UA: NEGATIVE
Glucose, UA: NEGATIVE
Ketones, UA: NEGATIVE
Leukocytes, UA: NEGATIVE
Nitrite, UA: NEGATIVE
Protein, UA: POSITIVE — AB
Spec Grav, UA: 1.01 (ref 1.010–1.025)
Urobilinogen, UA: 0.2 E.U./dL
pH, UA: 7 (ref 5.0–8.0)

## 2022-09-15 NOTE — Progress Notes (Signed)
ROB: No complaints.  Reports daily fetal movement.  Has MFM appointment in 1 week.  1 hour GCT next visit.  She is taking prenatal vitamins and iron at this time.

## 2022-09-15 NOTE — Progress Notes (Signed)
ROB. Patient states daily fetal movement, declines pain or pressure. She states no questions or concerns at this time. MFM ultrasound follow-up on 09/21/22.

## 2022-09-20 ENCOUNTER — Other Ambulatory Visit: Payer: Self-pay

## 2022-09-20 DIAGNOSIS — O283 Abnormal ultrasonic finding on antenatal screening of mother: Secondary | ICD-10-CM

## 2022-09-20 DIAGNOSIS — O99019 Anemia complicating pregnancy, unspecified trimester: Secondary | ICD-10-CM

## 2022-09-21 ENCOUNTER — Ambulatory Visit: Payer: BC Managed Care – PPO | Attending: Obstetrics and Gynecology

## 2022-09-21 ENCOUNTER — Other Ambulatory Visit: Payer: Self-pay

## 2022-09-21 VITALS — BP 121/73 | HR 80 | Ht 63.0 in | Wt 141.0 lb

## 2022-09-21 DIAGNOSIS — O283 Abnormal ultrasonic finding on antenatal screening of mother: Secondary | ICD-10-CM

## 2022-09-21 DIAGNOSIS — Z363 Encounter for antenatal screening for malformations: Secondary | ICD-10-CM | POA: Insufficient documentation

## 2022-09-21 DIAGNOSIS — Z3480 Encounter for supervision of other normal pregnancy, unspecified trimester: Secondary | ICD-10-CM

## 2022-09-21 DIAGNOSIS — O289 Unspecified abnormal findings on antenatal screening of mother: Secondary | ICD-10-CM | POA: Diagnosis not present

## 2022-09-21 DIAGNOSIS — Z3A24 24 weeks gestation of pregnancy: Secondary | ICD-10-CM | POA: Insufficient documentation

## 2022-09-21 DIAGNOSIS — O99019 Anemia complicating pregnancy, unspecified trimester: Secondary | ICD-10-CM

## 2022-10-11 ENCOUNTER — Ambulatory Visit (INDEPENDENT_AMBULATORY_CARE_PROVIDER_SITE_OTHER): Payer: BC Managed Care – PPO | Admitting: Obstetrics

## 2022-10-11 ENCOUNTER — Other Ambulatory Visit: Payer: BC Managed Care – PPO

## 2022-10-11 VITALS — BP 116/77 | HR 87 | Wt 140.0 lb

## 2022-10-11 DIAGNOSIS — Z113 Encounter for screening for infections with a predominantly sexual mode of transmission: Secondary | ICD-10-CM

## 2022-10-11 DIAGNOSIS — Z3A27 27 weeks gestation of pregnancy: Secondary | ICD-10-CM

## 2022-10-11 DIAGNOSIS — O0993 Supervision of high risk pregnancy, unspecified, third trimester: Secondary | ICD-10-CM

## 2022-10-11 DIAGNOSIS — Z131 Encounter for screening for diabetes mellitus: Secondary | ICD-10-CM | POA: Diagnosis not present

## 2022-10-11 DIAGNOSIS — Z3482 Encounter for supervision of other normal pregnancy, second trimester: Secondary | ICD-10-CM

## 2022-10-11 LAB — POCT URINALYSIS DIPSTICK OB
Bilirubin, UA: NEGATIVE
Blood, UA: NEGATIVE
Glucose, UA: NEGATIVE
Ketones, UA: NEGATIVE
Leukocytes, UA: NEGATIVE
Nitrite, UA: NEGATIVE
POC,PROTEIN,UA: NEGATIVE
Spec Grav, UA: 1.02 (ref 1.010–1.025)
Urobilinogen, UA: 0.2 E.U./dL
pH, UA: 7 (ref 5.0–8.0)

## 2022-10-11 NOTE — Addendum Note (Signed)
Addended by: Landis Gandy on: 10/11/2022 09:45 AM   Modules accepted: Orders

## 2022-10-11 NOTE — Progress Notes (Signed)
ROB at [redacted]w[redacted]d. Active baby. Denies ctx, LOF, and vaginal bleeding. She is feeling well overall. 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 Considering unmedicated birth d/t epidural not functioning at last birth. Reviewed options available. Encouraged CBE. 1-hour glucose, CBC, RPR today. Birth plan template and contraceptive handout given. RTC in 2 weeks.  Lurlean Horns, CNM

## 2022-10-12 ENCOUNTER — Other Ambulatory Visit: Payer: Self-pay

## 2022-10-12 DIAGNOSIS — O99019 Anemia complicating pregnancy, unspecified trimester: Secondary | ICD-10-CM

## 2022-10-12 LAB — 28 WEEK RH+PANEL
Basophils Absolute: 0 10*3/uL (ref 0.0–0.2)
Basos: 1 %
EOS (ABSOLUTE): 0.1 10*3/uL (ref 0.0–0.4)
Eos: 2 %
Gestational Diabetes Screen: 130 mg/dL (ref 70–139)
HIV Screen 4th Generation wRfx: NONREACTIVE
Hematocrit: 29.2 % — ABNORMAL LOW (ref 34.0–46.6)
Hemoglobin: 8.7 g/dL — ABNORMAL LOW (ref 11.1–15.9)
Immature Grans (Abs): 0 10*3/uL (ref 0.0–0.1)
Immature Granulocytes: 1 %
Lymphocytes Absolute: 1.1 10*3/uL (ref 0.7–3.1)
Lymphs: 27 %
MCH: 20.1 pg — ABNORMAL LOW (ref 26.6–33.0)
MCHC: 29.8 g/dL — ABNORMAL LOW (ref 31.5–35.7)
MCV: 67 fL — ABNORMAL LOW (ref 79–97)
Monocytes Absolute: 0.4 10*3/uL (ref 0.1–0.9)
Monocytes: 9 %
Neutrophils Absolute: 2.6 10*3/uL (ref 1.4–7.0)
Neutrophils: 60 %
Platelets: 249 10*3/uL (ref 150–450)
RBC: 4.33 x10E6/uL (ref 3.77–5.28)
RDW: 19.5 % — ABNORMAL HIGH (ref 11.7–15.4)
RPR Ser Ql: NONREACTIVE
WBC: 4.2 10*3/uL (ref 3.4–10.8)

## 2022-10-12 MED ORDER — FERROUS SULFATE 325 (65 FE) MG PO TABS: 325.0000 mg | ORAL_TABLET | Freq: Two times a day (BID) | ORAL | 2 refills | Status: AC

## 2022-10-13 ENCOUNTER — Encounter: Payer: Self-pay | Admitting: Obstetrics

## 2022-10-14 ENCOUNTER — Encounter: Payer: Self-pay | Admitting: Advanced Practice Midwife

## 2022-10-18 ENCOUNTER — Other Ambulatory Visit: Payer: Self-pay

## 2022-10-18 DIAGNOSIS — O283 Abnormal ultrasonic finding on antenatal screening of mother: Secondary | ICD-10-CM

## 2022-10-18 DIAGNOSIS — O99019 Anemia complicating pregnancy, unspecified trimester: Secondary | ICD-10-CM

## 2022-10-19 ENCOUNTER — Other Ambulatory Visit: Payer: Self-pay

## 2022-10-19 ENCOUNTER — Ambulatory Visit: Payer: BC Managed Care – PPO | Attending: Maternal & Fetal Medicine

## 2022-10-19 VITALS — BP 127/89 | HR 98 | Temp 97.6°F | Ht 63.0 in | Wt 143.5 lb

## 2022-10-19 DIAGNOSIS — O3509X0 Maternal care for (suspected) other central nervous system malformation or damage in fetus, not applicable or unspecified: Secondary | ICD-10-CM | POA: Diagnosis not present

## 2022-10-19 DIAGNOSIS — O283 Abnormal ultrasonic finding on antenatal screening of mother: Secondary | ICD-10-CM | POA: Insufficient documentation

## 2022-10-19 DIAGNOSIS — Z363 Encounter for antenatal screening for malformations: Secondary | ICD-10-CM | POA: Insufficient documentation

## 2022-10-19 DIAGNOSIS — Z3A28 28 weeks gestation of pregnancy: Secondary | ICD-10-CM | POA: Insufficient documentation

## 2022-10-19 DIAGNOSIS — O99019 Anemia complicating pregnancy, unspecified trimester: Secondary | ICD-10-CM

## 2022-10-19 DIAGNOSIS — Z3480 Encounter for supervision of other normal pregnancy, unspecified trimester: Secondary | ICD-10-CM

## 2022-10-25 ENCOUNTER — Encounter: Payer: Self-pay | Admitting: Advanced Practice Midwife

## 2022-10-25 ENCOUNTER — Ambulatory Visit (INDEPENDENT_AMBULATORY_CARE_PROVIDER_SITE_OTHER): Payer: BC Managed Care – PPO | Admitting: Advanced Practice Midwife

## 2022-10-25 VITALS — BP 116/74 | HR 81 | Wt 143.4 lb

## 2022-10-25 DIAGNOSIS — Z3A29 29 weeks gestation of pregnancy: Secondary | ICD-10-CM

## 2022-10-25 DIAGNOSIS — Z3483 Encounter for supervision of other normal pregnancy, third trimester: Secondary | ICD-10-CM

## 2022-10-25 LAB — POCT URINALYSIS DIPSTICK OB
Bilirubin, UA: NEGATIVE
Blood, UA: NEGATIVE
Glucose, UA: NEGATIVE
Ketones, UA: NEGATIVE
Leukocytes, UA: NEGATIVE
Nitrite, UA: NEGATIVE
POC,PROTEIN,UA: NEGATIVE
Spec Grav, UA: 1.02 (ref 1.010–1.025)
Urobilinogen, UA: 0.2 E.U./dL
pH, UA: 7 (ref 5.0–8.0)

## 2022-10-25 NOTE — Patient Instructions (Signed)

## 2022-10-25 NOTE — Progress Notes (Signed)
Routine Prenatal Care Visit  Subjective  Phyllis Jones is a 32 y.o. G3P2002 at 8w2dbeing seen today for ongoing prenatal care.  She is currently monitored for the following issues for this low-risk pregnancy and has Pregnancy; Supervision of other normal pregnancy, antepartum; and Anemia affecting third pregnancy on their problem list.  ----------------------------------------------------------------------------------- Patient reports no complaints.  She is taking her PNV with iron. Contractions: Not present. Vag. Bleeding: None.  Movement: Present. Leaking Fluid denies.  ----------------------------------------------------------------------------------- The following portions of the patient's history were reviewed and updated as appropriate: allergies, current medications, past family history, past medical history, past social history, past surgical history and problem list. Problem list updated.  Objective  Blood pressure 116/74, pulse 81, weight 143 lb 6.4 oz (65 kg), last menstrual period 04/03/2022, currently breastfeeding. Pregravid weight 119 lb (54 kg) Total Weight Gain 24 lb 6.4 oz (11.1 kg) Urinalysis: Urine Protein    Urine Glucose    Fetal Status: Fetal Heart Rate (bpm): 134 Fundal Height: 29 cm Movement: Present     General:  Alert, oriented and cooperative. Patient is in no acute distress.  Skin: Skin is warm and dry. No rash noted.   Cardiovascular: Normal heart rate noted  Respiratory: Normal respiratory effort, no problems with respiration noted  Abdomen: Soft, gravid, appropriate for gestational age. Pain/Pressure: Absent     Pelvic:  Cervical exam deferred        Extremities: Normal range of motion.  Edema: None  Mental Status: Normal mood and affect. Normal behavior. Normal judgment and thought content.   Assessment   32y.o. G3P2002 at 273w2dy  01/08/2023, by Last Menstrual Period presenting for routine prenatal visit  Plan   third Problems (from 05/31/22 to  present)     Problem Noted Resolved   Supervision of other normal pregnancy, antepartum 05/31/2022 by JoCleophas DunkerCMA No   Overview Addendum 08/17/2022  9:56 AM by GlRod CanCNM     Clinical Staff Provider  Office Location [facility] Dating  Not found.  Language  English Anatomy US[place]Possible abn cerebellum/f/u mfm  Flu Vaccine  offer Genetic Screen  NIPS: declined   TDaP vaccine   offer Hgb A1C or  GTT Early : NA Third trimester :   Covid One dose   LAB RESULTS   Rhogam  --/--/A POS (12/12 1304)  Blood Type --/--/A POS (12/12 1304)   Feeding Plan breast Antibody NEG (12/12 1304)  Contraception undecided Rubella    Circumcision yes RPR NON REACTIVE (12/12 1304)   Pediatrician  [facility]     Support Person MaRuthann CancerIV Non Reactive (09/22 1534)  Prenatal Classes no Varicella     GBS Negative/-- (12/01 1654)(For PCN allergy, check sensitivities)   BTL Consent  Hep C     VBAC Consent  Pap Diagnosis  Date Value Ref Range Status  07/28/2020      - Negative for intraepithelial lesion or malignancy (NILM)      Hgb Electro      CF      SMA        Gender surprise           Preterm labor symptoms and general obstetric precautions including but not limited to vaginal bleeding, contractions, leaking of fluid and fetal movement were reviewed in detail with the patient. Please refer to After Visit Summary for other counseling recommendations.   Return in about 2 weeks (around 11/08/2022) for rob.  JaRod CanCNM 10/25/2022  9:18 AM

## 2022-10-28 ENCOUNTER — Telehealth: Payer: Self-pay

## 2022-10-28 NOTE — Telephone Encounter (Signed)
Patient states having abnormal bleeding starting on Sudden and Date of onset: 10/28/22  for 1day. Patient states the bleeding is dark brown. Patients states when she went to the bathroom and wiped there was some dark brown blood.Just this one episode of brown blood.   Patient states no abdominal pain and cramping  Patientis pregnant 29w 5d. Patient reports having intercourse last night. Patient  has not had any recent surgeries.   UTI symptoms?  none      PLAN:- pt aware that if bleeding is Bright red with a consistent flow or even spotting to call and be seen or go to ER.

## 2022-11-08 ENCOUNTER — Encounter: Payer: Self-pay | Admitting: Obstetrics

## 2022-11-08 ENCOUNTER — Ambulatory Visit (INDEPENDENT_AMBULATORY_CARE_PROVIDER_SITE_OTHER): Payer: BC Managed Care – PPO | Admitting: Obstetrics

## 2022-11-08 VITALS — BP 117/74 | HR 82 | Wt 145.1 lb

## 2022-11-08 DIAGNOSIS — Z3A31 31 weeks gestation of pregnancy: Secondary | ICD-10-CM

## 2022-11-08 DIAGNOSIS — Z3483 Encounter for supervision of other normal pregnancy, third trimester: Secondary | ICD-10-CM

## 2022-11-08 LAB — POCT URINALYSIS DIPSTICK OB
Bilirubin, UA: NEGATIVE
Blood, UA: NEGATIVE
Glucose, UA: NEGATIVE
Ketones, UA: NEGATIVE
Leukocytes, UA: NEGATIVE
Nitrite, UA: NEGATIVE
Spec Grav, UA: 1.015 (ref 1.010–1.025)
Urobilinogen, UA: 0.2 E.U./dL
pH, UA: 6.5 (ref 5.0–8.0)

## 2022-11-08 NOTE — Progress Notes (Signed)
Routine Prenatal Care Visit  Subjective  Phyllis Jones is a 32 y.o. G3P2002 at 71w2dbeing seen today for ongoing prenatal care.  She is currently monitored for the following issues for this low-risk pregnancy and has Pregnancy; Supervision of other normal pregnancy, antepartum; and Anemia affecting third pregnancy on their problem list.  ----------------------------------------------------------------------------------- Patient reports no complaints.   Contractions: Not present. Vag. Bleeding: None.  Movement: Present. Leaking Fluid denies.  ----------------------------------------------------------------------------------- The following portions of the patient's history were reviewed and updated as appropriate: allergies, current medications, past family history, past medical history, past social history, past surgical history and problem list. Problem list updated.  Objective  Blood pressure 117/74, pulse 82, weight 145 lb 1.6 oz (65.8 kg), last menstrual period 04/03/2022, currently breastfeeding. Pregravid weight 119 lb (54 kg) Total Weight Gain 26 lb 1.6 oz (11.8 kg) Urinalysis: Urine Protein Trace  Urine Glucose Negative  Fetal Status:     Movement: Present     General:  Alert, oriented and cooperative. Patient is in no acute distress.  Skin: Skin is warm and dry. No rash noted.   Cardiovascular: Normal heart rate noted  Respiratory: Normal respiratory effort, no problems with respiration noted  Abdomen: Soft, gravid, appropriate for gestational age. Pain/Pressure: Absent     Pelvic:  Cervical exam deferred        Extremities: Normal range of motion.  Edema: None  Mental Status: Normal mood and affect. Normal behavior. Normal judgment and thought content.   Assessment   32y.o. G3P2002 at 341w2dy  01/08/2023, by Last Menstrual Period presenting for routine prenatal visit Fetal cerebellar hypoplasia  Plan   third Problems (from 05/31/22 to present)     Problem Noted Resolved    Supervision of other normal pregnancy, antepartum 05/31/2022 by JoCleophas DunkerCMA No   Overview Addendum 08/17/2022  9:56 AM by GlRod CanCNM     Clinical Staff Provider  Office Location [facility] Dating  Not found.  Language  English Anatomy US[place]oreaPossible abn cerebellum/f/u mfm  Flu Vaccine  offer Genetic Screen  NIPS: declined   TDaP vaccine   offer Hgb A1C or  GTT Early : NA Third trimester :   Covid One dose   LAB RESULTS   Rhogam  --/--/A POS (12/12 1304)  Blood Type --/--/A POS (12/12 1304)   Feeding Plan breast Antibody NEG (12/12 1304)  Contraception undecided Rubella    Circumcision yes RPR NON REACTIVE (12/12 1304)   Pediatrician  [facility]     Support Person MaRuthann CancerIV Non Reactive (09/22 1534)  Prenatal Classes no Varicella     GBS Negative/-- (12/01 1654)(For PCN allergy, check sensitivities)   BTL Consent  Hep C     VBAC Consent  Pap Diagnosis  Date Value Ref Range Status  07/28/2020      - Negative for intraepithelial lesion or malignancy (NILM)      Hgb Electro      CF      SMA        Gender surprise           Preterm labor symptoms and general obstetric precautions including but not limited to vaginal bleeding, contractions, leaking of fluid and fetal movement were reviewed in detail with the patient. Please refer to After Visit Summary for other counseling recommendations.  She will continue with f/u by MFM. Plan for MRI of the baby post delviery. She has used epidural with her two other births. Would  like to delivery unmedicated.Strongly encouraged to view videos of unmedcated births; use showers and tub prior to going to hospital; and provide birth plan asking for intermittent fetal heart tone checks for active labor.  No follow-ups on file.  Imagene Riches, CNM  11/08/2022 9:10 AM

## 2022-11-15 ENCOUNTER — Telehealth: Payer: Self-pay

## 2022-11-15 NOTE — Telephone Encounter (Signed)
Left message for patient to call office back - need to reschedule follow up ultrasound appt. From Tuesday 11/30/22 to Monday 3/18 or a Wednesday

## 2022-11-24 ENCOUNTER — Other Ambulatory Visit: Payer: Self-pay

## 2022-11-24 DIAGNOSIS — O359XX0 Maternal care for (suspected) fetal abnormality and damage, unspecified, not applicable or unspecified: Secondary | ICD-10-CM

## 2022-11-24 DIAGNOSIS — O99019 Anemia complicating pregnancy, unspecified trimester: Secondary | ICD-10-CM

## 2022-11-25 ENCOUNTER — Ambulatory Visit (INDEPENDENT_AMBULATORY_CARE_PROVIDER_SITE_OTHER): Payer: BC Managed Care – PPO | Admitting: Advanced Practice Midwife

## 2022-11-25 ENCOUNTER — Encounter: Payer: Self-pay | Admitting: Advanced Practice Midwife

## 2022-11-25 VITALS — BP 131/83 | HR 83 | Wt 148.0 lb

## 2022-11-25 DIAGNOSIS — Z3483 Encounter for supervision of other normal pregnancy, third trimester: Secondary | ICD-10-CM

## 2022-11-25 DIAGNOSIS — Z3A33 33 weeks gestation of pregnancy: Secondary | ICD-10-CM

## 2022-11-25 LAB — POCT URINALYSIS DIPSTICK OB
Bilirubin, UA: NEGATIVE
Glucose, UA: NEGATIVE
Ketones, UA: NEGATIVE
Leukocytes, UA: NEGATIVE
Nitrite, UA: NEGATIVE
POC,PROTEIN,UA: NEGATIVE
Spec Grav, UA: 1.025
Urobilinogen, UA: 0.2 U/dL
pH, UA: 6.5

## 2022-11-25 NOTE — Progress Notes (Signed)
Routine Prenatal Care Visit  Subjective  Phyllis Jones is a 32 y.o. G3P2002 at 13w5dbeing seen today for ongoing prenatal care.  She is currently monitored for the following issues for this low-risk pregnancy and has Pregnancy; Supervision of other normal pregnancy, antepartum; and Anemia affecting third pregnancy on their problem list.  ----------------------------------------------------------------------------------- Patient reports no complaints.  She would like to labor without an epidural and prefers wireless monitors or intermittent monitoring/use of shower. Contractions: Not present. Vag. Bleeding: None.  Movement: Present. Leaking Fluid denies.  ----------------------------------------------------------------------------------- The following portions of the patient's history were reviewed and updated as appropriate: allergies, current medications, past family history, past medical history, past social history, past surgical history and problem list. Problem list updated.  Objective  Blood pressure 131/83, pulse 83, weight 148 lb (67.1 kg), last menstrual period 04/03/2022, currently breastfeeding. Pregravid weight 119 lb (54 kg) Total Weight Gain 29 lb (13.2 kg) Urinalysis: Urine Protein    Urine Glucose    Fetal Status: Fetal Heart Rate (bpm): 150 Fundal Height: 33 cm Movement: Present     General:  Alert, oriented and cooperative. Patient is in no acute distress.  Skin: Skin is warm and dry. No rash noted.   Cardiovascular: Normal heart rate noted  Respiratory: Normal respiratory effort, no problems with respiration noted  Abdomen: Soft, gravid, appropriate for gestational age. Pain/Pressure: Absent     Pelvic:  Cervical exam deferred        Extremities: Normal range of motion.  Edema: None  Mental Status: Normal mood and affect. Normal behavior. Normal judgment and thought content.   Assessment   32y.o. G3P2002 at 353w5dy  01/08/2023, by Last Menstrual Period presenting for  routine prenatal visit  Plan   third Problems (from 05/31/22 to present)     Problem Noted Resolved   Supervision of other normal pregnancy, antepartum 05/31/2022 by JoCleophas DunkerCMA No   Overview Addendum 08/17/2022  9:56 AM by GlRod CanCNM     Clinical Staff Provider  Office Location [facility] Dating  Not found.  Language  English Anatomy US[place]Possible abn cerebellum/f/u mfm  Flu Vaccine  offer Genetic Screen  NIPS: declined   TDaP vaccine   offer Hgb A1C or  GTT Early : NA Third trimester :   Covid One dose   LAB RESULTS   Rhogam  --/--/A POS (12/12 1304)  Blood Type --/--/A POS (12/12 1304)   Feeding Plan breast Antibody NEG (12/12 1304)  Contraception undecided Rubella    Circumcision yes RPR NON REACTIVE (12/12 1304)   Pediatrician  [facility]     Support Person MaRuthann CancerIV Non Reactive (09/22 1534)  Prenatal Classes no Varicella     GBS Negative/-- (12/01 1654)(For PCN allergy, check sensitivities)   BTL Consent  Hep C     VBAC Consent  Pap Diagnosis  Date Value Ref Range Status  07/28/2020      - Negative for intraepithelial lesion or malignancy (NILM)      Hgb Electro      CF      SMA        Gender surprise           Preterm labor symptoms and general obstetric precautions including but not limited to vaginal bleeding, contractions, leaking of fluid and fetal movement were reviewed in detail with the patient. Please refer to After Visit Summary for other counseling recommendations.   Return in about 2 weeks (around 12/09/2022) for  rob.  Rod Can, CNM 11/25/2022 4:29 PM

## 2022-11-29 ENCOUNTER — Other Ambulatory Visit: Payer: Self-pay

## 2022-11-29 ENCOUNTER — Ambulatory Visit: Payer: BC Managed Care – PPO | Attending: Obstetrics and Gynecology

## 2022-11-29 ENCOUNTER — Encounter: Payer: Self-pay | Admitting: Advanced Practice Midwife

## 2022-11-29 VITALS — BP 118/81 | HR 77 | Ht 63.0 in | Wt 148.0 lb

## 2022-11-29 DIAGNOSIS — O359XX0 Maternal care for (suspected) fetal abnormality and damage, unspecified, not applicable or unspecified: Secondary | ICD-10-CM | POA: Insufficient documentation

## 2022-11-29 DIAGNOSIS — O3509X0 Maternal care for (suspected) other central nervous system malformation or damage in fetus, not applicable or unspecified: Secondary | ICD-10-CM

## 2022-11-29 DIAGNOSIS — Z3A34 34 weeks gestation of pregnancy: Secondary | ICD-10-CM | POA: Diagnosis not present

## 2022-11-29 DIAGNOSIS — D509 Iron deficiency anemia, unspecified: Secondary | ICD-10-CM | POA: Insufficient documentation

## 2022-11-29 DIAGNOSIS — O99013 Anemia complicating pregnancy, third trimester: Secondary | ICD-10-CM

## 2022-11-29 DIAGNOSIS — Z3480 Encounter for supervision of other normal pregnancy, unspecified trimester: Secondary | ICD-10-CM

## 2022-11-29 DIAGNOSIS — O99019 Anemia complicating pregnancy, unspecified trimester: Secondary | ICD-10-CM

## 2022-11-30 ENCOUNTER — Ambulatory Visit: Payer: BC Managed Care – PPO

## 2022-11-30 ENCOUNTER — Telehealth: Payer: Self-pay | Admitting: Certified Nurse Midwife

## 2022-11-30 NOTE — Telephone Encounter (Signed)
I left message for the patient to call back to confirm scheduled appointment for return visit 3/25-3/29 Routine OB appointment. She is scheduled for 3/27 at 2:35 pm with Deneise Lever.

## 2022-12-02 ENCOUNTER — Other Ambulatory Visit: Payer: Self-pay | Admitting: Advanced Practice Midwife

## 2022-12-02 DIAGNOSIS — O99019 Anemia complicating pregnancy, unspecified trimester: Secondary | ICD-10-CM

## 2022-12-02 NOTE — Progress Notes (Signed)
CBC ordered for lab only visit. Patient will call for appt.

## 2022-12-08 ENCOUNTER — Encounter: Payer: BC Managed Care – PPO | Admitting: Certified Nurse Midwife

## 2022-12-14 ENCOUNTER — Observation Stay
Admission: EM | Admit: 2022-12-14 | Discharge: 2022-12-14 | Disposition: A | Discharge status: Home / Self Care | Payer: BC Managed Care – PPO | Attending: Obstetrics | Admitting: Obstetrics

## 2022-12-14 ENCOUNTER — Encounter: Payer: Self-pay | Admitting: Obstetrics

## 2022-12-14 ENCOUNTER — Other Ambulatory Visit: Payer: Self-pay | Admitting: Obstetrics

## 2022-12-14 ENCOUNTER — Other Ambulatory Visit (HOSPITAL_COMMUNITY)
Admission: RE | Admit: 2022-12-14 | Discharge: 2022-12-14 | Disposition: A | Discharge status: Home / Self Care | Payer: BC Managed Care – PPO | Source: Ambulatory Visit | Attending: Obstetrics | Admitting: Obstetrics

## 2022-12-14 ENCOUNTER — Other Ambulatory Visit: Payer: Self-pay

## 2022-12-14 ENCOUNTER — Ambulatory Visit (INDEPENDENT_AMBULATORY_CARE_PROVIDER_SITE_OTHER): Payer: BC Managed Care – PPO | Admitting: Obstetrics

## 2022-12-14 VITALS — BP 148/91 | HR 118 | Wt 152.0 lb

## 2022-12-14 DIAGNOSIS — Z3A36 36 weeks gestation of pregnancy: Secondary | ICD-10-CM | POA: Diagnosis not present

## 2022-12-14 DIAGNOSIS — O99413 Diseases of the circulatory system complicating pregnancy, third trimester: Secondary | ICD-10-CM | POA: Insufficient documentation

## 2022-12-14 DIAGNOSIS — D649 Anemia, unspecified: Secondary | ICD-10-CM | POA: Diagnosis not present

## 2022-12-14 DIAGNOSIS — O26893 Other specified pregnancy related conditions, third trimester: Secondary | ICD-10-CM | POA: Insufficient documentation

## 2022-12-14 DIAGNOSIS — Z3685 Encounter for antenatal screening for Streptococcus B: Secondary | ICD-10-CM

## 2022-12-14 DIAGNOSIS — O133 Gestational [pregnancy-induced] hypertension without significant proteinuria, third trimester: Principal | ICD-10-CM | POA: Insufficient documentation

## 2022-12-14 DIAGNOSIS — Z3483 Encounter for supervision of other normal pregnancy, third trimester: Secondary | ICD-10-CM

## 2022-12-14 DIAGNOSIS — Z3480 Encounter for supervision of other normal pregnancy, unspecified trimester: Secondary | ICD-10-CM

## 2022-12-14 DIAGNOSIS — Z113 Encounter for screening for infections with a predominantly sexual mode of transmission: Secondary | ICD-10-CM

## 2022-12-14 DIAGNOSIS — O99013 Anemia complicating pregnancy, third trimester: Secondary | ICD-10-CM | POA: Insufficient documentation

## 2022-12-14 DIAGNOSIS — Z3490 Encounter for supervision of normal pregnancy, unspecified, unspecified trimester: Secondary | ICD-10-CM

## 2022-12-14 LAB — CBC WITH DIFFERENTIAL/PLATELET
Abs Immature Granulocytes: 0.06 10*3/uL (ref 0.00–0.07)
Basophils Absolute: 0.1 10*3/uL (ref 0.0–0.1)
Basophils Relative: 1 %
Eosinophils Absolute: 0.1 10*3/uL (ref 0.0–0.5)
Eosinophils Relative: 1 %
HCT: 30.4 % — ABNORMAL LOW (ref 36.0–46.0)
Hemoglobin: 8.7 g/dL — ABNORMAL LOW (ref 12.0–15.0)
Immature Granulocytes: 1 %
Lymphocytes Relative: 17 %
Lymphs Abs: 1.5 10*3/uL (ref 0.7–4.0)
MCH: 19.9 pg — ABNORMAL LOW (ref 26.0–34.0)
MCHC: 28.6 g/dL — ABNORMAL LOW (ref 30.0–36.0)
MCV: 69.4 fL — ABNORMAL LOW (ref 80.0–100.0)
Monocytes Absolute: 0.7 10*3/uL (ref 0.1–1.0)
Monocytes Relative: 8 %
Neutro Abs: 6.6 10*3/uL (ref 1.7–7.7)
Neutrophils Relative %: 72 %
Platelets: 254 10*3/uL (ref 150–400)
RBC: 4.38 MIL/uL (ref 3.87–5.11)
RDW: 19.7 % — ABNORMAL HIGH (ref 11.5–15.5)
Smear Review: NORMAL
WBC: 9 10*3/uL (ref 4.0–10.5)
nRBC: 0 % (ref 0.0–0.2)

## 2022-12-14 LAB — COMPREHENSIVE METABOLIC PANEL
ALT: 18 U/L (ref 0–44)
AST: 33 U/L (ref 15–41)
Albumin: 3.3 g/dL — ABNORMAL LOW (ref 3.5–5.0)
Alkaline Phosphatase: 144 U/L — ABNORMAL HIGH (ref 38–126)
Anion gap: 10 (ref 5–15)
BUN: 8 mg/dL (ref 6–20)
CO2: 22 mmol/L (ref 22–32)
Calcium: 8.4 mg/dL — ABNORMAL LOW (ref 8.9–10.3)
Chloride: 103 mmol/L (ref 98–111)
Creatinine, Ser: 0.56 mg/dL (ref 0.44–1.00)
GFR, Estimated: >60 mL/min (ref >60)
Glucose, Bld: 102 mg/dL — ABNORMAL HIGH (ref 70–99)
Potassium: 3.3 mmol/L — ABNORMAL LOW (ref 3.5–5.1)
Sodium: 135 mmol/L (ref 135–145)
Total Bilirubin: 0.7 mg/dL (ref 0.3–1.2)
Total Protein: 6.8 g/dL (ref 6.5–8.1)

## 2022-12-14 LAB — PROTEIN / CREATININE RATIO, URINE
Creatinine, Urine: 18 mg/dL
Total Protein, Urine: <6 mg/dL

## 2022-12-14 LAB — POCT URINALYSIS DIPSTICK OB
Bilirubin, UA: NEGATIVE
Blood, UA: NEGATIVE
Glucose, UA: NEGATIVE
Ketones, UA: NEGATIVE
Leukocytes, UA: NEGATIVE
Nitrite, UA: NEGATIVE
POC,PROTEIN,UA: NEGATIVE
Spec Grav, UA: 1.01 (ref 1.010–1.025)
Urobilinogen, UA: 0.2 E.U./dL
pH, UA: 6 (ref 5.0–8.0)

## 2022-12-14 MED ORDER — LACTATED RINGERS IV SOLN: 500.0000 mL | INTRAVENOUS | Status: DC | PRN

## 2022-12-14 MED ORDER — ACETAMINOPHEN 325 MG PO TABS: 650.0000 mg | ORAL_TABLET | ORAL | Status: DC | PRN

## 2022-12-14 MED ORDER — SOD CITRATE-CITRIC ACID 500-334 MG/5ML PO SOLN: 30.0000 mL | ORAL | Status: DC | PRN

## 2022-12-14 MED ORDER — HYDROXYZINE HCL 25 MG PO TABS: 50.0000 mg | ORAL_TABLET | Freq: Four times a day (QID) | ORAL | Status: DC | PRN

## 2022-12-14 NOTE — OB Triage Note (Signed)
LABOR & DELIVERY OB TRIAGE NOTE  SUBJECTIVE  HPI Phyllis Jones is a 32 y.o. G3P2002 at [redacted]w[redacted]d who presents to Labor & Delivery for evaluation of elevated blood pressures. She has remained normotensive in   OB History     Gravida  3   Para  2   Term  2   Preterm      AB      Living  2      SAB      IAB      Ectopic      Multiple  0   Live Births  2           Scheduled Meds: Continuous Infusions:  lactated ringers     PRN Meds:.acetaminophen, hydrOXYzine, lactated ringers, sodium citrate-citric acid  OBJECTIVE  BP 116/72   Pulse 88   Temp 98.5 F (36.9 C) (Oral)   Resp 18   LMP 04/03/2022 (Exact Date)   CBC    Component Value Date/Time   WBC 9.0 12/14/2022 1815   RBC 4.38 12/14/2022 1815   HGB 8.7 (L) 12/14/2022 1815   HGB 8.7 (L) 10/11/2022 0932   HCT 30.4 (L) 12/14/2022 1815   HCT 29.2 (L) 10/11/2022 0932   PLT 254 12/14/2022 1815   PLT 249 10/11/2022 0932   MCV 69.4 (L) 12/14/2022 1815   MCV 67 (L) 10/11/2022 0932   MCH 19.9 (L) 12/14/2022 1815   MCHC 28.6 (L) 12/14/2022 1815   RDW 19.7 (H) 12/14/2022 1815   RDW 19.5 (H) 10/11/2022 0932   LYMPHSABS 1.5 12/14/2022 1815   LYMPHSABS 1.1 10/11/2022 0932   MONOABS 0.7 12/14/2022 1815   EOSABS 0.1 12/14/2022 1815   EOSABS 0.1 10/11/2022 0932   BASOSABS 0.1 12/14/2022 1815   BASOSABS 0.0 10/11/2022 0932    CMP     Component Value Date/Time   NA 135 12/14/2022 1815   NA 134 02/22/2019 1112   K 3.3 (L) 12/14/2022 1815   CL 103 12/14/2022 1815   CO2 22 12/14/2022 1815   GLUCOSE 102 (H) 12/14/2022 1815   BUN 8 12/14/2022 1815   BUN 8 02/22/2019 1112   CREATININE 0.56 12/14/2022 1815   CALCIUM 8.4 (L) 12/14/2022 1815   PROT 6.8 12/14/2022 1815   ALBUMIN 3.3 (L) 12/14/2022 1815   AST 33 12/14/2022 1815   ALT 18 12/14/2022 1815   ALKPHOS 144 (H) 12/14/2022 1815   BILITOT 0.7 12/14/2022 1815   GFRNONAA >60 12/14/2022 1815   GFRAA 147 02/22/2019 1112    Protein/Creatinine ratio:  WNL  NST I reviewed the NST and it was reactive.  Baseline: 150 Variability: moderate Accelerations: present Decelerations:none Toco: irregular Category 1  ASSESSMENT Impression  1) Pregnancy at [id number], [redacted]w[redacted]d, Estimated Date of Delivery: 01/08/23. Reassuring maternal/fetal status 2) Normotensive. Tachycardia resolved. 3) Anemia  PLAN  1) Discharge home with standard return/labor precautions. Reviewed warning signs of elevated BP. 2) Keep scheduled ROB appt 3) Patient declines iron infusion at this time but will accept as outpatient. Will place order.  Lloyd Huger, CNM 12/14/22   8:59 PM

## 2022-12-14 NOTE — Progress Notes (Signed)
RN/CNM reviewed fetal strip to be reactive. Patient's BP has been within normal range. Lab results reviewed. Pre-E warning signs reviewed. RN reviewed "after visit summary" with patient including medications, appointments, and instructions. Patient states she plans to make an outpatient iron infusion appointment. All questions answered at this time. Patient ambulated with partner for discharge. No belongings left in room.

## 2022-12-14 NOTE — OB Triage Note (Signed)
LABOR & DELIVERY OB TRIAGE NOTE  SUBJECTIVE  HPI Phyllis Jones is a 32 y.o. G3P2002 at [redacted]w[redacted]d who presents to Labor & Delivery for preeclampsia workup. She was found to have two elevated BPs at a routine prenatal visit today. She denies HA, visual changes, epigastric pain, and s/s of labor. She denies N/V/D and exposure to illness. She states that she has been eating and drinking normally and has been taking her iron. She endorses good fetal movement.  OB History     Gravida  3   Para  2   Term  2   Preterm      AB      Living  2      SAB      IAB      Ectopic      Multiple  0   Live Births  2           Scheduled Meds: Continuous Infusions:  lactated ringers     PRN Meds:.acetaminophen, hydrOXYzine, lactated ringers, sodium citrate-citric acid  OBJECTIVE  BP 124/80 (BP Location: Left Arm)   Pulse (!) 110   Temp 98.5 F (36.9 C) (Oral)   Resp 18   LMP 04/03/2022 (Exact Date)   General: alert, cooperative, NAD Heart: RRR, tachycardic Lungs: CTAB, no increased WOB Abdomen: soft, gravid, non-tender Cervical exam: deferred DTRs: 2+, no clonus  NST I reviewed the NST and it was reactive.  Baseline: 150 Variability: moderate Accelerations: present Decelerations:none Toco: irregular contractions Category 1  ASSESSMENT Impression   Pregnancy at [id number], [redacted]w[redacted]d, Estimated Date of Delivery: 01/08/23. Reassuring maternal/fetal status 1) Elevated blood pressures in clinic 2) Maternal tachycardia 3) Anemia  PLAN  1) Serial blood pressures. Labs: CBC, CMP, urine protein/creatinine ration 2) Push PO fluids, IV hydration PRN 3) Consider iron infusion based on CBC results  Lloyd Huger, CNM 12/14/22  6:48 PM

## 2022-12-14 NOTE — Discharge Instructions (Signed)
Norberto Sorenson CNM will send you a secure message on mychart regarding how to make your outpatient appointment for an iron transfusion. Continue taking iron supplement & prenatal.

## 2022-12-14 NOTE — Progress Notes (Addendum)
Routine Prenatal Care Visit  Subjective  Phyllis Jones is a 32 y.o. G3P2002 at [redacted]w[redacted]d being seen today for ongoing prenatal care.  She is currently monitored for the following issues for this low-risk pregnancy and has Pregnancy; Supervision of other normal pregnancy, antepartum; and Anemia affecting third pregnancy on their problem list.  ----------------------------------------------------------------------------------- Patient reports no complaints.  Her initial blood pressure is elevated at 140/89. Rechecked and it is also high. She denies any headaches or visual changes. She does not know the gender Contractions: Not present. Vag. Bleeding: None.  Movement: Present. Leaking Fluid denies.  ----------------------------------------------------------------------------------- The following portions of the patient's history were reviewed and updated as appropriate: allergies, current medications, past family history, past medical history, past social history, past surgical history and problem list. Problem list updated.  Objective  Blood pressure (!) 140/89, pulse 96, weight 152 lb (68.9 kg), last menstrual period 04/03/2022, currently breastfeeding. Pregravid weight 119 lb (54 kg) Total Weight Gain 33 lb (15 kg) Urinalysis: Urine Protein Negative  Urine Glucose Negative  Fetal Status:     Movement: Present     General:  Alert, oriented and cooperative. Patient is in no acute distress.  Skin: Skin is warm and dry. No rash noted.   Cardiovascular: Normal heart rate noted  Respiratory: Normal respiratory effort, no problems with respiration noted  Abdomen: Soft, gravid, appropriate for gestational age. Pain/Pressure: Absent     Pelvic:  Cervical exam deferred        Extremities: Normal range of motion.  Edema: Trace  Mental Status: Normal mood and affect. Normal behavior. Normal judgment and thought content.   Assessment   32 y.o. G3P2002 at [redacted]w[redacted]d by  01/08/2023, by Last Menstrual Period  presenting for routine prenatal visit  Plan   third Problems (from 05/31/22 to present)     Problem Noted Resolved   Supervision of other normal pregnancy, antepartum 05/31/2022 by Cleophas Dunker, CMA No   Overview Addendum 12/14/2022 12:51 PM by Imagene Riches, CNM     Clinical Staff Provider  Office Location [facility] Dating  Not found.  Language  English Anatomy US  Possible abn cerebellum/f/u mfm  Flu Vaccine  offer Genetic Screen  NIPS: declined   TDaP vaccine   offer Hgb A1C or  GTT Early : NA Third trimester :   Covid One dose   LAB RESULTS   Rhogam  --/--/A POS (12/12 1304)  Blood Type --/--/A POS (12/12 1304)   Feeding Plan breast Antibody NEG (12/12 1304)  Contraception undecided Rubella  immune  Circumcision yes RPR NON REACTIVE (12/12 1304)   Pediatrician  [facility]   neg  Support Person Ruthann Cancer HIV Non Reactive (09/22 1534)  Prenatal Classes no Varicella Non immune    GBS Negative/-- (12/01 1654)(For PCN allergy, check sensitivities)   BTL Consent  Hep C     VBAC Consent  Pap Diagnosis  Date Value Ref Range Status  07/28/2020      - Negative for intraepithelial lesion or malignancy (NILM)      Hgb Electro      CF      SMA        Gender surprise           Preterm labor symptoms and general obstetric precautions including but not limited to vaginal bleeding, contractions, leaking of fluid and fetal movement were reviewed in detail with the patient. Please refer to After Visit Summary for other counseling recommendations.  GBS and  cultures  done today. Her second blood pressure is: 149/91 Discussed her two elevated measures and POC is to have her go to the [facility] for labs, EFM and serial BPs. She verbalizes understanding.  Return in about 1 week (around 12/21/2022) for return OB, check BP carefully.  Imagene Riches, CNM  12/14/2022 4:56 PM

## 2022-12-15 ENCOUNTER — Telehealth: Payer: Self-pay | Admitting: Obstetrics

## 2022-12-15 ENCOUNTER — Telehealth: Payer: Self-pay

## 2022-12-15 DIAGNOSIS — Z3480 Encounter for supervision of other normal pregnancy, unspecified trimester: Secondary | ICD-10-CM | POA: Diagnosis not present

## 2022-12-15 NOTE — Telephone Encounter (Signed)
I contacted the patient via phone. The patient is aware to contact [facility] at [phone] for scheduling.

## 2022-12-15 NOTE — Telephone Encounter (Signed)
Susa Raring,  Jaylan sent over a message stating that you and her talked about her going out of work on April 15th? Is this correct? So I can update her FMLA

## 2022-12-15 NOTE — Telephone Encounter (Signed)
-----   Message from Lurlean Horns, CNM sent at 12/14/2022  9:09 PM EDT ----- Regarding: Iron infusion Hi!  I saw Phyllis Jones in triage tonight, and she needs an iron infusion. Could you please contact [facility] to get her scheduled?  [phone]  Thank you!  Missy

## 2022-12-16 LAB — CERVICOVAGINAL ANCILLARY ONLY
Chlamydia: NEGATIVE
Comment: NEGATIVE
Comment: NORMAL
Neisseria Gonorrhea: NEGATIVE

## 2022-12-17 LAB — STREP GP B NAA: Strep Gp B NAA: NEGATIVE

## 2022-12-23 ENCOUNTER — Ambulatory Visit
Admission: RE | Admit: 2022-12-23 | Discharge: 2022-12-23 | Disposition: A | Discharge status: Home / Self Care | Payer: BC Managed Care – PPO | Source: Ambulatory Visit | Attending: Obstetrics | Admitting: Obstetrics

## 2022-12-23 ENCOUNTER — Ambulatory Visit (INDEPENDENT_AMBULATORY_CARE_PROVIDER_SITE_OTHER): Payer: BC Managed Care – PPO | Admitting: Obstetrics

## 2022-12-23 VITALS — BP 119/86 | HR 81 | Wt 148.0 lb

## 2022-12-23 DIAGNOSIS — Z3480 Encounter for supervision of other normal pregnancy, unspecified trimester: Secondary | ICD-10-CM

## 2022-12-23 DIAGNOSIS — Z01818 Encounter for other preprocedural examination: Secondary | ICD-10-CM | POA: Diagnosis not present

## 2022-12-23 DIAGNOSIS — O1213 Gestational proteinuria, third trimester: Secondary | ICD-10-CM | POA: Diagnosis not present

## 2022-12-23 DIAGNOSIS — Z3A37 37 weeks gestation of pregnancy: Secondary | ICD-10-CM

## 2022-12-23 LAB — POCT URINALYSIS DIPSTICK OB
Bilirubin, UA: NEGATIVE
Blood, UA: NEGATIVE
Glucose, UA: NEGATIVE
Ketones, UA: NEGATIVE
Leukocytes, UA: NEGATIVE
Nitrite, UA: NEGATIVE
Spec Grav, UA: 1.01 (ref 1.010–1.025)
Urobilinogen, UA: 0.2 E.U./dL
pH, UA: 6.5 (ref 5.0–8.0)

## 2022-12-23 MED ORDER — SODIUM CHLORIDE 0.9 % IV SOLN
300.0000 mg | Freq: Once | INTRAVENOUS | Status: AC
  Administered 2022-12-23: 300 mg via INTRAVENOUS
  Filled 2022-12-23: qty 300

## 2022-12-23 MED ORDER — SODIUM CHLORIDE 0.9 % IV SOLN
500.0000 mg | Freq: Once | INTRAVENOUS | Status: DC
  Filled 2022-12-23: qty 25

## 2022-12-23 MED ORDER — IRON SUCROSE 500 MG IVPB - SIMPLE MED
500.0000 mg | Freq: Once | INTRAVENOUS | Status: DC
  Filled 2022-12-23: qty 275

## 2022-12-23 MED ORDER — IRON SUCROSE 500 MG IVPB - SIMPLE MED: 500.0000 mg | Freq: Once | INTRAVENOUS | Status: DC

## 2022-12-23 MED ORDER — SODIUM CHLORIDE 0.9 % IV SOLN: INTRAVENOUS | Status: DC | PRN

## 2022-12-23 NOTE — Progress Notes (Signed)
Routine Prenatal Care Visit  Subjective  Phyllis Jones is a 32 y.o. G3P2002 at [redacted]w[redacted]d being seen today for ongoing prenatal care.  She is currently monitored for the following issues for this low-risk pregnancy and has Pregnancy; Supervision of other normal pregnancy, antepartum; Anemia affecting third pregnancy; Pregnant; and Labor and delivery, indication for care on their problem list.  ----------------------------------------------------------------------------------- Patient reports no complaints.   Contractions: Not present. Vag. Bleeding: None.  Movement: Present. Leaking Fluid denies.  ----------------------------------------------------------------------------------- The following portions of the patient's history were reviewed and updated as appropriate: allergies, current medications, past family history, past medical history, past social history, past surgical history and problem list. Problem list updated.  Objective  Blood pressure 119/86, pulse 81, weight 148 lb (67.1 kg), last menstrual period 04/03/2022, currently breastfeeding. Pregravid weight 119 lb (54 kg) Total Weight Gain 29 lb (13.2 kg) Urinalysis: Urine Protein Moderate (2+)  Urine Glucose Negative  Fetal Status:     Movement: Present     General:  Alert, oriented and cooperative. Patient is in no acute distress.  Skin: Skin is warm and dry. No rash noted.   Cardiovascular: Normal heart rate noted  Respiratory: Normal respiratory effort, no problems with respiration noted  Abdomen: Soft, gravid, appropriate for gestational age. Pain/Pressure: Absent     Pelvic:  Cervical exam deferred        Extremities: Normal range of motion.     Mental Status: Normal mood and affect. Normal behavior. Normal judgment and thought content.   Assessment   32 y.o. G3P2002 at [redacted]w[redacted]d by  01/08/2023, by Last Menstrual Period presenting for routine prenatal visit  Plan   third Problems (from 05/31/22 to present)     Problem Noted  Resolved   Supervision of other normal pregnancy, antepartum 05/31/2022 by Loran Senters, CMA No   Overview Addendum 12/14/2022 12:51 PM by Mirna Mires, CNM     Clinical Staff Provider  Office Location [facility] Dating  Not found.  Language  English Anatomy US  Possible abn cerebellum/f/u mfm  Flu Vaccine  offer Genetic Screen  NIPS: declined   TDaP vaccine   offer Hgb A1C or  GTT Early : NA Third trimester :   Covid One dose   LAB RESULTS   Rhogam  --/--/A POS (12/12 1304)  Blood Type --/--/A POS (12/12 1304)   Feeding Plan breast Antibody NEG (12/12 1304)  Contraception undecided Rubella  immune  Circumcision yes RPR NON REACTIVE (12/12 1304)   Pediatrician  [facility] HBsAg   neg  Support Person Gaynell Face HIV Non Reactive (09/22 1534)  Prenatal Classes no Varicella Non immune    GBS Negative/-- (12/01 1654)(For PCN allergy, check sensitivities)   BTL Consent  Hep C     VBAC Consent  Pap Diagnosis  Date Value Ref Range Status  07/28/2020      - Negative for intraepithelial lesion or malignancy (NILM)      Hgb Electro      CF      SMA        Gender surprise           Term labor symptoms and general obstetric precautions including but not limited to vaginal bleeding, contractions, leaking of fluid and fetal movement were reviewed in detail with the patient. Please refer to After Visit Summary for other counseling recommendations.  She has been followed by MFM for the fetal hypoplasia. Her urine was very dark and had 2+ proetin today. No headaches or  other complaints- sent for culture.  Return in about 1 week (around 12/30/2022) for return OB.  Mirna Mires, CNM  12/23/2022 3:17 PM

## 2022-12-25 LAB — URINE CULTURE: Organism ID, Bacteria: NO GROWTH

## 2022-12-30 ENCOUNTER — Encounter: Payer: BC Managed Care – PPO | Admitting: Obstetrics

## 2022-12-30 ENCOUNTER — Ambulatory Visit (INDEPENDENT_AMBULATORY_CARE_PROVIDER_SITE_OTHER): Payer: BC Managed Care – PPO | Admitting: Licensed Practical Nurse

## 2022-12-30 ENCOUNTER — Encounter: Payer: Self-pay | Admitting: Licensed Practical Nurse

## 2022-12-30 ENCOUNTER — Ambulatory Visit (INDEPENDENT_AMBULATORY_CARE_PROVIDER_SITE_OTHER): Payer: BC Managed Care – PPO

## 2022-12-30 VITALS — BP 136/90 | HR 76 | Ht 63.0 in | Wt 146.2 lb

## 2022-12-30 VITALS — BP 136/90 | HR 76 | Wt 146.2 lb

## 2022-12-30 DIAGNOSIS — Z3480 Encounter for supervision of other normal pregnancy, unspecified trimester: Secondary | ICD-10-CM

## 2022-12-30 DIAGNOSIS — Z3A37 37 weeks gestation of pregnancy: Secondary | ICD-10-CM

## 2022-12-30 DIAGNOSIS — O133 Gestational [pregnancy-induced] hypertension without significant proteinuria, third trimester: Secondary | ICD-10-CM | POA: Diagnosis not present

## 2022-12-30 DIAGNOSIS — Z3A38 38 weeks gestation of pregnancy: Secondary | ICD-10-CM

## 2022-12-30 DIAGNOSIS — Z3493 Encounter for supervision of normal pregnancy, unspecified, third trimester: Secondary | ICD-10-CM | POA: Diagnosis not present

## 2022-12-30 LAB — POCT URINALYSIS DIPSTICK
Bilirubin, UA: NEGATIVE
Blood, UA: NEGATIVE
Glucose, UA: NEGATIVE
Ketones, UA: NEGATIVE
Nitrite, UA: NEGATIVE
Protein, UA: NEGATIVE
Spec Grav, UA: 1.015
Urobilinogen, UA: 1 U/dL
pH, UA: 6.5

## 2022-12-30 NOTE — Progress Notes (Signed)
    NURSE VISIT NOTE  Subjective:    Patient ID: Phyllis Jones, female    DOB: [birth year], 32 y.o.   MRN: [id number]  HPI  Patient is a 32 y.o. G76P2002 female who presents for fetal monitoring per order from Carie Caddy, [place].   Objective:    BP (!) 136/90   Pulse 76   Ht  (1.6 m)   Wt 146 lb 3.2 oz (66.3 kg)   LMP 04/03/2022 (Exact Date)   BMI 25.90 kg/m  Estimated Date of Delivery: 01/08/23  [redacted]w[redacted]d  Fetus A Non-Stress Test Interpretation for 12/30/22  Indication:  Gestational Hypertension  Fetal Heart Rate A Mode: External Baseline Rate (A): 140 bpm Variability: Moderate Accelerations: 15 x 15 Decelerations: None Multiple birth?: No  Uterine Activity Mode: Toco Contraction Frequency (min): 4 Contraction Duration (sec): 60 Contraction Quality: Mild Resting Time: Adequate  Interpretation (Fetal Testing) Nonstress Test Interpretation: Reactive Overall Impression: Reassuring for gestational age   Assessment:   1. Gestational hypertension, third trimester   2. [redacted] weeks gestation of pregnancy      Plan:   Results reviewed and discussed with patient by  Carie Caddy, CNM.     Rocco Serene, LPN

## 2022-12-30 NOTE — Progress Notes (Addendum)
Routine Prenatal Care Visit  Subjective  Phyllis Jones is a 32 y.o. G3P2002 at [redacted]w[redacted]d being seen today for ongoing prenatal care.  She is currently monitored for the following issues for this low-risk pregnancy and has Pregnancy; Supervision of other normal pregnancy, antepartum; Anemia affecting third pregnancy; Pregnant; Labor and delivery, indication for care; and Gestational hypertension, third trimester on their problem list.  ----------------------------------------------------------------------------------- Patient reports no complaints.  Doing well.  BP elevated today, denies HA, visual disturbances and RUQ pain. Has been checking her BP at home DBP have been in the 80's "for the most part". Reviewed BP with Dr Valentino Saxon, pt now has GTHN. Will do labs and NST in office, rec IOL by 40wks.  -RNST. Pt prefers spontaneous labor but understands recommendation IOL scheduled for 4/27 at 0800.  Understands method of induction is based on her cervical time at the time of admission.  -Desires VE and sweep at next visit   Contractions: Not present. Vag. Bleeding: None.  Movement: Present. Leaking Fluid denies.  ----------------------------------------------------------------------------------- The following portions of the patient's history were reviewed and updated as appropriate: allergies, current medications, past family history, past medical history, past social history, past surgical history and problem list. Problem list updated.  Objective  Blood pressure (!) 136/90, pulse 76, weight 146 lb 3.2 oz (66.3 kg), last menstrual period 04/03/2022, currently breastfeeding. Pregravid weight 119 lb (54 kg) Total Weight Gain 27 lb 3.2 oz (12.3 kg) Urinalysis: Urine Protein    Urine Glucose    Fetal Status: Fetal Heart Rate (bpm): 140 Fundal Height: 38 cm Movement: Present     General:  Alert, oriented and cooperative. Patient is in no acute distress.  Skin: Skin is warm and dry. No rash noted.    Cardiovascular: Normal heart rate noted  Respiratory: Normal respiratory effort, no problems with respiration noted  Abdomen: Soft, gravid, appropriate for gestational age. Pain/Pressure: Absent     Pelvic:  Cervical exam deferred        Extremities: Normal range of motion.     Mental Status: Normal mood and affect. Normal behavior. Normal judgment and thought content.   Assessment   32 y.o. [id number] at [redacted]w[redacted]d by  01/08/2023, by Last Menstrual Period presenting for routine prenatal visit  Plan   third Problems (from 05/31/22 to present)     Problem Noted Resolved   Supervision of other normal pregnancy, antepartum 05/31/2022 by Loran Senters, CMA No   Overview Addendum 12/14/2022 12:51 PM by Mirna Mires, CNM     Clinical Staff Provider  Office Location [facility] Dating  Not found.  Language  English Anatomy US  Possible abn cerebellum/f/u mfm  Flu Vaccine  offer Genetic Screen  NIPS: declined   TDaP vaccine   offer Hgb A1C or  GTT Early : NA Third trimester :   Covid One dose   LAB RESULTS   Rhogam  --/--/A POS (12/12 1304)  Blood Type --/--/A POS (12/12 1304)   Feeding Plan breast Antibody NEG (12/12 1304)  Contraception undecided Rubella  immune  Circumcision yes RPR NON REACTIVE (12/12 1304)   Pediatrician  [facility] HBsAg   neg  Support Person Gaynell Face HIV Non Reactive (09/22 1534)  Prenatal Classes no Varicella Non immune    GBS Negative/-- (12/01 1654)(For PCN allergy, check sensitivities)   BTL Consent  Hep C     VBAC Consent  Pap Diagnosis  Date Value Ref Range Status  07/28/2020      - Negative for  intraepithelial lesion or malignancy (NILM)      Hgb Electro      CF      SMA        Gender surprise           Term labor symptoms and general obstetric precautions including but not limited to vaginal bleeding, contractions, leaking of fluid and fetal movement were reviewed in detail with the patient. Please refer to After Visit Summary for other  counseling recommendations.   PIH labs collected  RNST IOL 4/27 at 0500, orders placed  RTC 1 week, NST   Jannifer Hick  [facility], [facility]  12/30/22  12:26 PM

## 2022-12-31 LAB — COMPREHENSIVE METABOLIC PANEL
ALT: 19 IU/L (ref 0–32)
AST: 27 IU/L (ref 0–40)
Albumin/Globulin Ratio: 1.5 (ref 1.2–2.2)
Albumin: 3.8 g/dL — ABNORMAL LOW (ref 4.0–5.0)
Alkaline Phosphatase: 197 IU/L — ABNORMAL HIGH (ref 44–121)
BUN/Creatinine Ratio: 12 (ref 9–23)
BUN: 8 mg/dL (ref 6–20)
Bilirubin Total: 0.4 mg/dL (ref 0.0–1.2)
CO2: 19 mmol/L — ABNORMAL LOW (ref 20–29)
Calcium: 8.9 mg/dL (ref 8.7–10.2)
Chloride: 102 mmol/L (ref 96–106)
Creatinine, Ser: 0.67 mg/dL (ref 0.57–1.00)
Globulin, Total: 2.5 g/dL (ref 1.5–4.5)
Glucose: 89 mg/dL (ref 70–99)
Potassium: 4.2 mmol/L (ref 3.5–5.2)
Sodium: 136 mmol/L (ref 134–144)
Total Protein: 6.3 g/dL (ref 6.0–8.5)
eGFR: 121 mL/min/{1.73_m2} (ref >59)

## 2022-12-31 LAB — CBC WITH DIFFERENTIAL/PLATELET
Basophils Absolute: 0.1 10*3/uL (ref 0.0–0.2)
Basos: 1 %
EOS (ABSOLUTE): 0.2 10*3/uL (ref 0.0–0.4)
Eos: 2 %
Hematocrit: 32.9 % — ABNORMAL LOW (ref 34.0–46.6)
Hemoglobin: 9.4 g/dL — ABNORMAL LOW (ref 11.1–15.9)
Immature Grans (Abs): 0.1 10*3/uL (ref 0.0–0.1)
Immature Granulocytes: 1 %
Lymphocytes Absolute: 1.5 10*3/uL (ref 0.7–3.1)
Lymphs: 19 %
MCH: 20.4 pg — ABNORMAL LOW (ref 26.6–33.0)
MCHC: 28.6 g/dL — ABNORMAL LOW (ref 31.5–35.7)
MCV: 71 fL — ABNORMAL LOW (ref 79–97)
Monocytes Absolute: 0.6 10*3/uL (ref 0.1–0.9)
Monocytes: 8 %
Neutrophils Absolute: 5.5 10*3/uL (ref 1.4–7.0)
Neutrophils: 69 %
Platelets: 220 10*3/uL (ref 150–450)
RBC: 4.61 x10E6/uL (ref 3.77–5.28)
RDW: 22.4 % — ABNORMAL HIGH (ref 11.7–15.4)
WBC: 7.9 10*3/uL (ref 3.4–10.8)

## 2022-12-31 LAB — PROTEIN / CREATININE RATIO, URINE
Creatinine, Urine: 67.7 mg/dL
Protein, Ur: 13.8 mg/dL
Protein/Creat Ratio: 204 mg/g creat — ABNORMAL HIGH (ref 0–200)

## 2023-01-01 ENCOUNTER — Encounter: Payer: Self-pay | Admitting: Licensed Practical Nurse

## 2023-01-01 LAB — URINE CULTURE

## 2023-01-01 NOTE — Progress Notes (Signed)
  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 EDC Estimated Date of Delivery: 4/27/24based on:US at [redacted] wks  Gestational age on admission: 35 Gravidity/parity:G3P2002  Cervix Score   0 Position Posterior Midposition Anterior   Consistency Firm Medium Soft   Effacement (%) 0-30 40-50 60-70 >80  Dilation (cm) Closed 1-2 3-4 >5  Baby's station -3 -2 -1 +1, +2   Bishop Score: VE not done    Medical induction of labor  select indication(s) below Elective induction ?39 weeks multiparous patient ?39 weeks primiparous patient with Bishop score ?7 ?40 weeks primiparous patient   Medical Indications Adapted from ACOG Committee Opinion #[id number], "Medically Indicated Late Preterm and Early Term Deliveries," 2013.  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 (41 weeks) Mo-Di isolated (32.0-34.6) Pregestational, vascular compromise (37.0- 39.0)  PPROM (34.0) Multiple Gestation Gestational, diet  controlled (40.0)  Hx of IUFD (39.0 weeks) Di-Di (38.0-38.6) Gestational, med controlled (39.0)  Polyhydramnios, mild/moderate; SDV 8-16 or AFI 25-35 (39.0) Mo-Di (36.0-37.6) Gestational, uncontrolled (38.0-39.0)  Oligohydramnios (36.0-37.6); MVP <2 cm  For indications not listed above, delivery recommendations from maternal-fetal medicine consultant occurred on: Date:12/30/22 with Dr. Valentino Saxon  for indication MV:[id number] HTN   Provider Signature: Ellouise Newer [facility] Scheduled by Hurley Cisco D  Date:01/01/2023 11:55 AM   Call [phone] to finalize the induction date/time  [id number] (07/17)

## 2023-01-03 ENCOUNTER — Encounter: Payer: Self-pay | Admitting: Licensed Practical Nurse

## 2023-01-03 DIAGNOSIS — D225 Melanocytic nevi of trunk: Secondary | ICD-10-CM | POA: Diagnosis not present

## 2023-01-03 DIAGNOSIS — L720 Epidermal cyst: Secondary | ICD-10-CM | POA: Diagnosis not present

## 2023-01-03 DIAGNOSIS — L578 Other skin changes due to chronic exposure to nonionizing radiation: Secondary | ICD-10-CM | POA: Diagnosis not present

## 2023-01-06 ENCOUNTER — Encounter: Payer: Self-pay | Admitting: Medical

## 2023-01-06 ENCOUNTER — Other Ambulatory Visit: Payer: BC Managed Care – PPO

## 2023-01-06 ENCOUNTER — Ambulatory Visit (INDEPENDENT_AMBULATORY_CARE_PROVIDER_SITE_OTHER): Payer: BC Managed Care – PPO

## 2023-01-06 ENCOUNTER — Ambulatory Visit (INDEPENDENT_AMBULATORY_CARE_PROVIDER_SITE_OTHER): Payer: BC Managed Care – PPO | Admitting: Medical

## 2023-01-06 VITALS — BP 121/81 | HR 100 | Wt 149.0 lb

## 2023-01-06 DIAGNOSIS — D649 Anemia, unspecified: Secondary | ICD-10-CM

## 2023-01-06 DIAGNOSIS — O133 Gestational [pregnancy-induced] hypertension without significant proteinuria, third trimester: Secondary | ICD-10-CM

## 2023-01-06 DIAGNOSIS — O99019 Anemia complicating pregnancy, unspecified trimester: Secondary | ICD-10-CM

## 2023-01-06 DIAGNOSIS — Z3480 Encounter for supervision of other normal pregnancy, unspecified trimester: Secondary | ICD-10-CM

## 2023-01-06 DIAGNOSIS — Z3A39 39 weeks gestation of pregnancy: Secondary | ICD-10-CM

## 2023-01-06 DIAGNOSIS — O99013 Anemia complicating pregnancy, third trimester: Secondary | ICD-10-CM

## 2023-01-06 LAB — POCT URINALYSIS DIPSTICK OB
Bilirubin, UA: NEGATIVE
Blood, UA: NEGATIVE
Glucose, UA: NEGATIVE
Ketones, UA: NEGATIVE
Leukocytes, UA: NEGATIVE
Nitrite, UA: NEGATIVE
POC,PROTEIN,UA: NEGATIVE
Spec Grav, UA: 1.01 (ref 1.010–1.025)
Urobilinogen, UA: 0.2 E.U./dL
pH, UA: 6.5 (ref 5.0–8.0)

## 2023-01-06 NOTE — Progress Notes (Signed)
    NURSE VISIT NOTE  Subjective:    Patient ID: Phyllis Jones, female    DOB: 1991-02-04, 32 y.o.   MRN: [id number]  HPI  Patient is a 32 y.o. G44P2002 female who presents for fetal monitoring per order from Carie Caddy, [place]. Patient states she has no pain or pressure and no contractions. She reports good fetal movement.  Objective:    LMP 04/03/2022 (Exact Date)  Estimated Date of Delivery: 01/08/23  [redacted]w[redacted]d  Fetus A Non-Stress Test Interpretation for 01/06/23  Indication: Gestational Hypertension   Fetal Heart Rate A Mode: External Multiple birth?: No  Uterine Activity Mode: Toco Contraction Quality: Mild Resting Tone Palpated: Relaxed Resting Time: Adequate  Interpretation (Fetal Testing) Nonstress Test Interpretation: Reactive Overall Impression: Reassuring for gestational age   Assessment:   1. Gestational hypertension, third trimester   2. [redacted] weeks gestation of pregnancy   3. Supervision of other normal pregnancy, antepartum      Plan:   She is scheduled for Induction on Saturday. Results reviewed and discussed with patient by  Carie Caddy, CNM.     Tommie Raymond, CMA

## 2023-01-06 NOTE — Progress Notes (Signed)
   PRENATAL VISIT NOTE  Subjective:  Phyllis Jones is a 32 y.o. G3P2002 at [redacted]w[redacted]d being seen today for ongoing prenatal care.  She is currently monitored for the following issues for this high-risk pregnancy and has Supervision of other normal pregnancy, antepartum; Anemia affecting third pregnancy; Labor and delivery, indication for care; and Gestational hypertension, third trimester on their problem list.  Patient reports occasional contractions.  Contractions: Not present. Vag. Bleeding: None.  Movement: Present. Denies leaking of fluid.   The following portions of the patient's history were reviewed and updated as appropriate: allergies, current medications, past family history, past medical history, past social history, past surgical history and problem list.   Objective:   Vitals:   01/06/23 1400  BP: 121/81  Pulse: 100  Weight: 149 lb (67.6 kg)    Fetal Status: Fetal Heart Rate (bpm): 148 Fundal Height: 38 cm Movement: Present  Presentation: Vertex  General:  Alert, oriented and cooperative. Patient is in no acute distress.  Skin: Skin is warm and dry. No rash noted.   Cardiovascular: Normal heart rate noted  Respiratory: Normal respiratory effort, no problems with respiration noted  Abdomen: Soft, gravid, appropriate for gestational age.  Pain/Pressure: Absent     Pelvic: Cervical exam performed in the presence of a chaperone Dilation: 1 Effacement (%): 20 Station: -3  Extremities: Normal range of motion.     Mental Status: Normal mood and affect. Normal behavior. Normal judgment and thought content.   Assessment and Plan:  Pregnancy: G3P2002 at [redacted]w[redacted]d 1. Supervision of other normal pregnancy, antepartum - POC Urinalysis Dipstick OB  2. Gestational hypertension, third trimester - Normotensive today - IOL already scheduled for 4/27 - patient denies HA, edema, blurred vision or RUQ pain  - NST today  Fetal Monitoring: Baseline: 115 bpm Variability:  moderate Accelerations: 15 x 15 Decelerations: none Contractions: few, irregular   3. Anemia affecting third pregnancy - PO iron  4. [redacted] weeks gestation of pregnancy  Term labor symptoms and general obstetric precautions including but not limited to vaginal bleeding, contractions, leaking of fluid and fetal movement were reviewed in detail with the patient. Please refer to After Visit Summary for other counseling recommendations.   Return if symptoms worsen or fail to improve, for IOL 4/27.  No future appointments.  Vonzella Nipple, PA-C

## 2023-01-08 ENCOUNTER — Encounter: Payer: Self-pay | Admitting: Obstetrics and Gynecology

## 2023-01-08 ENCOUNTER — Other Ambulatory Visit: Payer: Self-pay

## 2023-01-08 ENCOUNTER — Inpatient Hospital Stay
Admission: RE | Admit: 2023-01-08 | Discharge: 2023-01-09 | DRG: 807 | Disposition: A | Discharge status: Home / Self Care | Payer: BC Managed Care – PPO | Attending: Certified Nurse Midwife | Admitting: Certified Nurse Midwife

## 2023-01-08 DIAGNOSIS — O480 Post-term pregnancy: Secondary | ICD-10-CM

## 2023-01-08 DIAGNOSIS — O9902 Anemia complicating childbirth: Secondary | ICD-10-CM | POA: Diagnosis present

## 2023-01-08 DIAGNOSIS — Z23 Encounter for immunization: Secondary | ICD-10-CM | POA: Diagnosis not present

## 2023-01-08 DIAGNOSIS — O134 Gestational [pregnancy-induced] hypertension without significant proteinuria, complicating childbirth: Principal | ICD-10-CM

## 2023-01-08 DIAGNOSIS — Z3490 Encounter for supervision of normal pregnancy, unspecified, unspecified trimester: Secondary | ICD-10-CM | POA: Diagnosis present

## 2023-01-08 DIAGNOSIS — Z3A40 40 weeks gestation of pregnancy: Secondary | ICD-10-CM

## 2023-01-08 DIAGNOSIS — Z3480 Encounter for supervision of other normal pregnancy, unspecified trimester: Secondary | ICD-10-CM

## 2023-01-08 LAB — COMPREHENSIVE METABOLIC PANEL
ALT: 15 U/L (ref 0–44)
AST: 25 U/L (ref 15–41)
Albumin: 3 g/dL — ABNORMAL LOW (ref 3.5–5.0)
Alkaline Phosphatase: 183 U/L — ABNORMAL HIGH (ref 38–126)
Anion gap: 9 (ref 5–15)
BUN: 11 mg/dL (ref 6–20)
CO2: 20 mmol/L — ABNORMAL LOW (ref 22–32)
Calcium: 8.3 mg/dL — ABNORMAL LOW (ref 8.9–10.3)
Chloride: 106 mmol/L (ref 98–111)
Creatinine, Ser: 0.68 mg/dL (ref 0.44–1.00)
GFR, Estimated: >60 mL/min (ref >60)
Glucose, Bld: 133 mg/dL — ABNORMAL HIGH (ref 70–99)
Potassium: 3.4 mmol/L — ABNORMAL LOW (ref 3.5–5.1)
Sodium: 135 mmol/L (ref 135–145)
Total Bilirubin: 0.6 mg/dL (ref 0.3–1.2)
Total Protein: 6 g/dL — ABNORMAL LOW (ref 6.5–8.1)

## 2023-01-08 LAB — CBC
HCT: 32.1 % — ABNORMAL LOW (ref 36.0–46.0)
Hemoglobin: 9.1 g/dL — ABNORMAL LOW (ref 12.0–15.0)
MCH: 20.7 pg — ABNORMAL LOW (ref 26.0–34.0)
MCHC: 28.3 g/dL — ABNORMAL LOW (ref 30.0–36.0)
MCV: 73.1 fL — ABNORMAL LOW (ref 80.0–100.0)
Platelets: 266 10*3/uL (ref 150–400)
RBC: 4.39 MIL/uL (ref 3.87–5.11)
RDW: 23.9 % — ABNORMAL HIGH (ref 11.5–15.5)
WBC: 6.2 10*3/uL (ref 4.0–10.5)
nRBC: 0 % (ref 0.0–0.2)

## 2023-01-08 LAB — TYPE AND SCREEN
ABO/RH(D): A POS
Antibody Screen: NEGATIVE

## 2023-01-08 LAB — PROTEIN / CREATININE RATIO, URINE
Creatinine, Urine: 184 mg/dL
Protein Creatinine Ratio: 0.18 mg/mg{Cre} — ABNORMAL HIGH (ref 0.00–0.15)
Total Protein, Urine: 33 mg/dL

## 2023-01-08 MED ORDER — FERROUS SULFATE 325 (65 FE) MG PO TABS
325.0000 mg | ORAL_TABLET | Freq: Every day | ORAL | Status: DC
  Administered 2023-01-09: 325 mg via ORAL
  Filled 2023-01-08: qty 1

## 2023-01-08 MED ORDER — ONDANSETRON HCL 4 MG PO TABS: 4.0000 mg | ORAL_TABLET | ORAL | Status: DC | PRN

## 2023-01-08 MED ORDER — SOD CITRATE-CITRIC ACID 500-334 MG/5ML PO SOLN: 30.0000 mL | ORAL | Status: DC | PRN

## 2023-01-08 MED ORDER — TERBUTALINE SULFATE 1 MG/ML IJ SOLN
INTRAMUSCULAR | Status: AC
  Filled 2023-01-08: qty 1

## 2023-01-08 MED ORDER — COCONUT OIL OIL: 1.0000 | TOPICAL_OIL | Status: DC | PRN

## 2023-01-08 MED ORDER — OXYCODONE-ACETAMINOPHEN 5-325 MG PO TABS: 2.0000 | ORAL_TABLET | ORAL | Status: DC | PRN

## 2023-01-08 MED ORDER — MISOPROSTOL 50MCG HALF TABLET
50.0000 ug | ORAL_TABLET | ORAL | Status: DC | PRN
  Filled 2023-01-08: qty 1

## 2023-01-08 MED ORDER — HYDROXYZINE HCL 25 MG PO TABS: 50.0000 mg | ORAL_TABLET | Freq: Four times a day (QID) | ORAL | Status: DC | PRN

## 2023-01-08 MED ORDER — LACTATED RINGERS IV SOLN: INTRAVENOUS | Status: DC

## 2023-01-08 MED ORDER — BENZOCAINE-MENTHOL 20-0.5 % EX AERO: 1.0000 | INHALATION_SPRAY | CUTANEOUS | Status: DC | PRN

## 2023-01-08 MED ORDER — OXYTOCIN-SODIUM CHLORIDE 30-0.9 UT/500ML-% IV SOLN: 2.5000 [IU]/h | INTRAVENOUS | Status: DC

## 2023-01-08 MED ORDER — LACTATED RINGERS IV SOLN
500.0000 mL | INTRAVENOUS | Status: DC | PRN
  Administered 2023-01-08: 500 mL via INTRAVENOUS

## 2023-01-08 MED ORDER — DOCUSATE SODIUM 100 MG PO CAPS
100.0000 mg | ORAL_CAPSULE | Freq: Two times a day (BID) | ORAL | Status: DC
  Administered 2023-01-09: 100 mg via ORAL
  Filled 2023-01-08: qty 1

## 2023-01-08 MED ORDER — MISOPROSTOL 25 MCG QUARTER TABLET
25.0000 ug | ORAL_TABLET | Freq: Once | ORAL | Status: AC
  Administered 2023-01-08: 25 ug via ORAL
  Filled 2023-01-08: qty 1

## 2023-01-08 MED ORDER — ACETAMINOPHEN 325 MG PO TABS: 650.0000 mg | ORAL_TABLET | ORAL | Status: DC | PRN

## 2023-01-08 MED ORDER — OXYTOCIN 10 UNIT/ML IJ SOLN
INTRAMUSCULAR | Status: AC
  Filled 2023-01-08: qty 2

## 2023-01-08 MED ORDER — DIBUCAINE (PERIANAL) 1 % EX OINT: 1.0000 | TOPICAL_OINTMENT | CUTANEOUS | Status: DC | PRN

## 2023-01-08 MED ORDER — AMMONIA AROMATIC IN INHA
RESPIRATORY_TRACT | Status: AC
  Filled 2023-01-08: qty 10

## 2023-01-08 MED ORDER — OXYCODONE-ACETAMINOPHEN 5-325 MG PO TABS: 1.0000 | ORAL_TABLET | ORAL | Status: DC | PRN

## 2023-01-08 MED ORDER — IBUPROFEN 600 MG PO TABS
600.0000 mg | ORAL_TABLET | Freq: Four times a day (QID) | ORAL | Status: DC
  Administered 2023-01-08 – 2023-01-09 (×4): 600 mg via ORAL
  Filled 2023-01-08 (×4): qty 1

## 2023-01-08 MED ORDER — PRENATAL MULTIVITAMIN CH
1.0000 | ORAL_TABLET | Freq: Every day | ORAL | Status: DC
  Administered 2023-01-09: 1 via ORAL
  Filled 2023-01-08: qty 1

## 2023-01-08 MED ORDER — MISOPROSTOL 200 MCG PO TABS
ORAL_TABLET | ORAL | Status: AC
  Filled 2023-01-08: qty 4

## 2023-01-08 MED ORDER — OXYTOCIN-SODIUM CHLORIDE 30-0.9 UT/500ML-% IV SOLN
1.0000 m[IU]/min | INTRAVENOUS | Status: DC
  Filled 2023-01-08: qty 500

## 2023-01-08 MED ORDER — WITCH HAZEL-GLYCERIN EX PADS: 1.0000 | MEDICATED_PAD | CUTANEOUS | Status: DC | PRN

## 2023-01-08 MED ORDER — ONDANSETRON HCL 4 MG/2ML IJ SOLN: 4.0000 mg | Freq: Four times a day (QID) | INTRAMUSCULAR | Status: DC | PRN

## 2023-01-08 MED ORDER — SENNOSIDES-DOCUSATE SODIUM 8.6-50 MG PO TABS
2.0000 | ORAL_TABLET | ORAL | Status: DC
  Filled 2023-01-08: qty 2

## 2023-01-08 MED ORDER — FENTANYL CITRATE (PF) 100 MCG/2ML IJ SOLN: 50.0000 ug | INTRAMUSCULAR | Status: DC | PRN

## 2023-01-08 MED ORDER — OXYTOCIN BOLUS FROM INFUSION
333.0000 mL | Freq: Once | INTRAVENOUS | Status: AC
  Administered 2023-01-08: 333 mL via INTRAVENOUS

## 2023-01-08 MED ORDER — ONDANSETRON HCL 4 MG/2ML IJ SOLN: 4.0000 mg | INTRAMUSCULAR | Status: DC | PRN

## 2023-01-08 MED ORDER — SIMETHICONE 80 MG PO CHEW: 80.0000 mg | CHEWABLE_TABLET | ORAL | Status: DC | PRN

## 2023-01-08 MED ORDER — LIDOCAINE HCL (PF) 1 % IJ SOLN
30.0000 mL | INTRAMUSCULAR | Status: DC | PRN
  Filled 2023-01-08: qty 30

## 2023-01-08 MED ORDER — MISOPROSTOL 50MCG HALF TABLET
50.0000 ug | ORAL_TABLET | Freq: Once | ORAL | Status: AC
  Administered 2023-01-08: 50 ug via VAGINAL
  Filled 2023-01-08: qty 1

## 2023-01-08 MED ORDER — TERBUTALINE SULFATE 1 MG/ML IJ SOLN: 0.2500 mg | Freq: Once | INTRAMUSCULAR | Status: DC | PRN

## 2023-01-08 NOTE — H&P (Signed)
History and Physical   HPI  Phyllis Jones is a 32 y.o. G3P2002 at [redacted]w[redacted]d Estimated Date of Delivery: 01/08/23 who is being admitted for induction of labor for Gestational hypertension.   OB History  OB History  Gravida Para Term Preterm AB Living  3 2 2  0 0 2  SAB IAB Ectopic Multiple Live Births  0 0 0 0 2    # Outcome Date GA Lbr Len/2nd Weight Sex Delivery Anes PTL Lv  3 Current           2 Term 08/24/21 [redacted]w[redacted]d 10:58 / 01:07 3590 g F Vag-Spont EPI  LIV     Name: Phyllis,GIRL Jones     Apgar1: 8  Apgar5: 9  1 Term 09/12/19 [redacted]w[redacted]d 08:26 / 02:00 4100 g F Vag-Spont EPI  LIV     Name: Phyllis,GIRL Jones    PROBLEM LIST  Pregnancy complications or risks: Patient Active Problem List   Diagnosis Date Noted   Encounter for induction of labor 01/08/2023   Gestational hypertension, third trimester 12/30/2022   Labor and delivery, indication for care 12/14/2022   Anemia affecting third pregnancy 07/19/2022   Supervision of other normal pregnancy, antepartum 05/31/2022    Prenatal labs and studies: ABO, Rh: A/Positive/-- (10/04 0825) Antibody: Negative (10/04 0825) Rubella: 7.27 (10/04 0825) RPR: Non Reactive (01/29 0932)  HBsAg: Negative (10/04 0825)  HIV: Non Reactive (01/29 0932)  ONG:EXBMWUXL/-- (04/03 1017)   Past Medical History:  Diagnosis Date   Anemia      Past Surgical History:  Procedure Laterality Date   TONSILLECTOMY  2004     Medications    Current Discharge Medication List     CONTINUE these medications which have NOT CHANGED   Details  ferrous sulfate 325 (65 FE) MG tablet Take 1 tablet (325 mg total) by mouth 2 (two) times daily with a meal. Qty: 60 tablet, Refills: 2   Associated Diagnoses: Anemia during pregnancy    Prenatal Vit-Fe Fumarate-FA (MULTIVITAMIN-PRENATAL) 27-0.8 MG TABS tablet Take 1 tablet by mouth daily at 12 noon.         Allergies  Patient has no known allergies.  Review of Systems  Constitutional: negative Eyes:  negative Ears, nose, mouth, throat, and face: negative Respiratory: negative Cardiovascular: negative Gastrointestinal: negative Genitourinary:negative Integument/breast: negative Hematologic/lymphatic: negative Musculoskeletal:negative Neurological: negative Behavioral/Psych: negative Endocrine: negative Allergic/Immunologic: negative  Physical Exam  BP (!) 148/89 (BP Location: Left Arm)   Pulse (!) 106   Temp 98.1 F (36.7 C) (Oral)   Resp 18   Ht 5\' 3"  (1.6 m)   Wt 67.6 kg   LMP 04/03/2022 (Exact Date)   BMI 26.39 kg/m   Lungs:  CTA B Cardio: RRR without M/R/G Abd: Soft, gravid, NT Presentation: cephalic EXT: No C/C/ 1+ Edema DTRs: 2+ B CERVIX:    See Prenatal records for more detailed PE.     FHR:  Baseline: 150 bpm, Variability: Good {> 6 bpm), Accelerations: Reactive, and Decelerations: Early  Toco: Uterine Contractions: irregular  Test Results  No results found for this or any previous visit (from the past 24 hour(s)). Group B Strep negative  Assessment   G3P2002 at [redacted]w[redacted]d Estimated Date of Delivery: 01/08/23  The fetus is reassuring.   Patient Active Problem List   Diagnosis Date Noted   Encounter for induction of labor 01/08/2023   Gestational hypertension, third trimester 12/30/2022   Labor and delivery, indication for care 12/14/2022   Anemia affecting third pregnancy 07/19/2022   Supervision  of other normal pregnancy, antepartum 05/31/2022    Plan  1. Admit to L&D :   2. EFM: -- Category 1 3. Pain medication or Epidural if desired.   4. Admission labs  5.  Dr.Beasley notified of admission   Elonda Husky, M.D.Doreene Burke, CNM  01/08/2023 9:01 AM

## 2023-01-08 NOTE — Progress Notes (Signed)
LABOR NOTE   Phyllis Jones 31 y.o.GP@ at [redacted]w[redacted]d  SUBJECTIVE:  Feeling very uncomfortable, standing at bedside with support person.  Analgesia: Labor support without medications  OBJECTIVE:  BP 117/70   Pulse 67   Temp 98.7 F (37.1 C) (Oral)   Resp 18   Ht 5\' 3"  (1.6 m)   Wt 67.6 kg   LMP 04/03/2022 (Exact Date)   BMI 26.39 kg/m  Total I/O In: 54.2 [I.V.:54.2] Out: 150 [Blood:150]  She has shown cervical change. CERVIX: 7cm:  100%:   -1:   mid position:   soft SVE:   Dilation: 10 Effacement (%): 70 Station: 0 Exam by:: Janee Morn CNM CONTRACTIONS: regular, every 1-2 minutes FHR: Fetal heart tracing reviewed. Baseline: 150 bpm, Variability: Good {> 6 bpm), Accelerations: Reactive, and Decelerations: variable  Category II    Labs: Lab Results  Component Value Date   WBC 6.2 01/08/2023   HGB 9.1 (L) 01/08/2023   HCT 32.1 (L) 01/08/2023   MCV 73.1 (L) 01/08/2023   PLT 266 01/08/2023    ASSESSMENT: 1) Labor curve reviewed.       Progress: Active phase labor.     Membranes: ruptured, clear fluid             Principal Problem:   Encounter for induction of labor Gestational hypertension  PLAN: continue present management, pt to bed , placed in hands an knees , IV fluid bolus. Pt progressing rapidly. Dr. Dalbert Garnet notified. Terbutaline to bedside.   Doreene Burke, CNM  01/08/2023 6:58 PM

## 2023-01-08 NOTE — Plan of Care (Signed)
  Problem: Education: Goal: Knowledge of General Education information will improve Description: Including pain rating scale, medication(s)/side effects and non-pharmacologic comfort measures Outcome: Progressing   Problem: Safety: Goal: Ability to remain free from injury will improve Outcome: Progressing   Problem: Activity: Goal: Will verbalize the importance of balancing activity with adequate rest periods Outcome: Progressing   Problem: Life Cycle: Goal: Chance of risk for complications during the postpartum period will decrease Outcome: Progressing

## 2023-01-08 NOTE — Progress Notes (Signed)
LABOR NOTE   Phyllis Jones 32 y.o.GP@ at [redacted]w[redacted]d  SUBJECTIVE:  Feeling some mild cramping Analgesia: Labor support without medications  OBJECTIVE:  BP (!) 127/91   Pulse 96   Temp 99.3 F (37.4 C) (Oral)   Resp 18   Ht 5\' 3"  (1.6 m)   Wt 67.6 kg   LMP 04/03/2022 (Exact Date)   BMI 26.39 kg/m  Total I/O In: 54.2 [I.V.:54.2] Out: -   She has shown cervical change. CERVIX: 3 cm:  70%:   -2:   posterior:   soft SVE:   Dilation: 3 Effacement (%): 70 Station: -2 Exam by:: A Damascus Feldpausch CNM CONTRACTIONS: regular, every 1-2 minutes FHR: Fetal heart tracing reviewed. Baseline: 155 bpm, Variability: Good {> 6 bpm), Accelerations: Reactive, and Decelerations: Absent Category I    Labs: Lab Results  Component Value Date   WBC 6.2 01/08/2023   HGB 9.1 (L) 01/08/2023   HCT 32.1 (L) 01/08/2023   MCV 73.1 (L) 01/08/2023   PLT 266 01/08/2023    ASSESSMENT: 1) Labor curve reviewed.       Progress: Early latent labor.     Membranes: intact            Principal Problem:   Encounter for induction of labor Gestational hypertension  PLAN: continue present management Discussed possible AROM at next exam.   Doreene Burke, CNM  01/08/2023 3:07 PM

## 2023-01-09 LAB — CBC
HCT: 30.4 % — ABNORMAL LOW (ref 36.0–46.0)
Hemoglobin: 8.7 g/dL — ABNORMAL LOW (ref 12.0–15.0)
MCH: 20.9 pg — ABNORMAL LOW (ref 26.0–34.0)
MCHC: 28.6 g/dL — ABNORMAL LOW (ref 30.0–36.0)
MCV: 72.9 fL — ABNORMAL LOW (ref 80.0–100.0)
Platelets: 235 10*3/uL (ref 150–400)
RBC: 4.17 MIL/uL (ref 3.87–5.11)
RDW: 23.8 % — ABNORMAL HIGH (ref 11.5–15.5)
WBC: 9.9 10*3/uL (ref 4.0–10.5)
nRBC: 0 % (ref 0.0–0.2)

## 2023-01-09 LAB — RPR: RPR Ser Ql: NONREACTIVE

## 2023-01-09 NOTE — Lactation Note (Signed)
This note was copied from a baby's chart. Lactation Consultation Note  Patient Name: Girl Tal Neer [id number] Date: 01/09/2023 Age:30 hours  Reason for consult: initial assessment, term   Maternal Data  This is mom's 3rd baby, SVD. Mom is an experienced breastfeeding mother. Mom can readily hand express colostrum. Mom breast fed 2 previous children for 4 months. Per mom once she returned to work it was too challenging to breastfeed and pump at work.  Feeding  Mother's choice on admission is breast feeding. Baby breastfed well, multiple swallows noted.   LATCH Score=9  1 point off of latch score for lower lip latch . Instructed mom rolling lip outward on to areola. Mom independently corrected latch.  Interventions  Breastfeeding basics. Provided tips to maximize latch technique.  Discharge  Engorgement management and breastcare, when to call the baby's pediatrician in respect to breastfeeding, outpatient LC recommendation /resources.  Consult Status  Complete.   Update provided to care nurse and NP Reller.  Fuller Song 01/09/2023, 6:13 PM

## 2023-01-09 NOTE — Discharge Summary (Signed)
OB Discharge Summary     Patient Name: Phyllis Jones DOB: [birth year] MRN: [id number]  Date of admission: 01/08/2023 Delivering Provider: Doreene Burke CNM Date of Delivery: 01/09/2023  Date of discharge: 01/09/2023  Admitting diagnosis: Encounter for induction of labor [Z34.90] Intrauterine pregnancy: [redacted]w[redacted]d     Secondary diagnosis: Gestational Hypertension and Anemia     Discharge diagnosis: Term Pregnancy Delivered, Gestational Hypertension, and Anemia                                                                                                Post partum procedures: iron infusion offered but declined by patient  Augmentation: Cytotec  Complications: None  Hospital course:  Induction of Labor With Vaginal Delivery   32 y.o. yo G3P2002 at [redacted]w[redacted]d was admitted to the hospital 01/08/2023 for induction of labor.  Indication for induction: Gestational hypertension.  Patient had an labor course without complication. Received one dose of cytotec and then became active and progressed to complete.  Membrane Rupture Time/Date: 6:15 PM ,01/08/2023   Delivery Method:Vaginal, Spontaneous  Episiotomy: None  Lacerations:  None  Details of delivery can be found in separate delivery note.     Pt. Is eating, hydrating, and voiding regularly without difficulty. Has yet to have BM. She is breastfeeding and baby is latching well. Reports small amount of vaginal bleeding, denies passing large blood clots. Has had cramping abdomen pain relieved with ibuprofen alone. Unsure of contraceptive plans but is aware of options and will consider. Denies anxiety/depression symptoms. Endorses good support from partner and family.     Newborn Data: Birth date:01/08/2023  Birth time:6:36 PM  Gender:Female  Living status:Living  Apgars:7 ,9  Weight:4190 g   Subjective:   Physical exam  Vitals:   01/09/23 0325 01/09/23 0749 01/09/23 1127 01/09/23 1649  BP: 138/85 (!) 136/90 134/86 123/76  Pulse: 69 69 81  78  Resp: 18 18 18 16   Temp: 97.6 F (36.4 C) 97.7 F (36.5 C) 98 F (36.7 C) 98.4 F (36.9 C)  TempSrc: Oral Oral Oral Oral  SpO2: 100% 100% 100% 100%  Weight:      Height:       General: alert, cooperative, and no distress Breast: soft, non-tender, nipples without breakdown Lochia: appropriate Uterine Fundus: firm Perineum: no erythema or foul odor discharge, minimal edema DVT Evaluation: No evidence of DVT seen on physical exam. Negative Homan's sign. No cords or calf tenderness. No significant calf/ankle edema.  Labs: Lab Results  Component Value Date   WBC 9.9 01/09/2023   HGB 8.7 (L) 01/09/2023   HCT 30.4 (L) 01/09/2023   MCV 72.9 (L) 01/09/2023   PLT 235 01/09/2023   Assessment: 31y/oG3P3 1d s/p NSVB following IOL for GHTN Occasional mild range Bps Breastfeeding Anemia- declined iron infusion, hemodynamically stable Undecided on contraception   Discharge instruction: in After Visit Summary.  Medications:  Allergies as of 01/09/2023   No Known Allergies      Medication List     TAKE these medications    ferrous sulfate 325 (65 FE) MG tablet Take 1 tablet (325 mg  total) by mouth 2 (two) times daily with a meal.   multivitamin-prenatal 27-0.8 MG Tabs tablet Take 1 tablet by mouth daily at 12 noon.         Activity: Advance as tolerated. Pelvic rest for 6 weeks.   Outpatient follow up:     Postpartum contraception: Undecided Rhogam Given postpartum: not indicated Rubella vaccine given postpartum: not indicated Varicella vaccine given postpartum: to be offered before discharge TDaP given antepartum or postpartum: to be offered before discharge  Newborn Data: Live born female  Birth Weight: 9 lb 3.8 oz (4190 g) APGAR: 7, 9  Newborn Delivery   Birth date/time: 01/08/2023 18:36:00 Delivery type: Vaginal, Spontaneous       Baby Feeding: Breast  Disposition:home with mother   Wannetta Sender, FNP 01/09/2023 6:06 PM

## 2023-01-10 NOTE — Addendum Note (Signed)
Addended by: Tommie Raymond on: 01/10/2023 10:05 AM   Modules accepted: Level of Service

## 2023-01-11 ENCOUNTER — Encounter: Payer: Self-pay | Admitting: Medical

## 2023-01-16 IMAGING — US US OB COMP +14 WK
2 series · 15 of 28 positions shown · non-contrast
Comparison: none

CLINICAL DATA: Second trimester pregnancy for fetal anatomy survey.

EXAM:
OBSTETRICAL ULTRASOUND >14 WKS

[Series 1: us ob comp + 14 wk · 14 of 95 slices shown (1 of 2)]
[im 1/95]
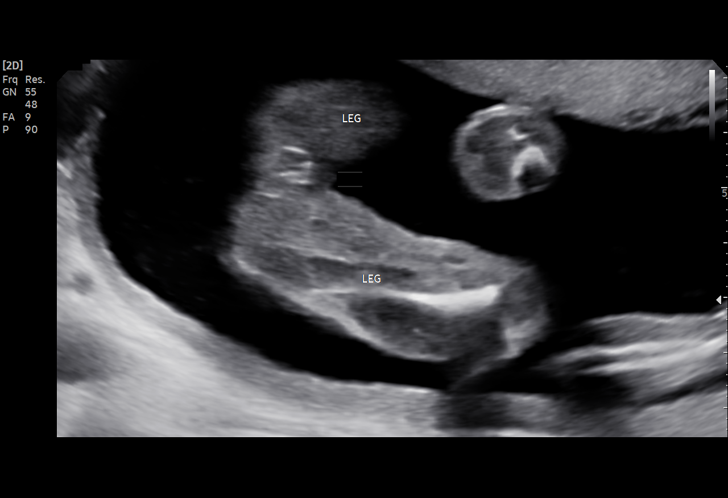
[im 8/95]
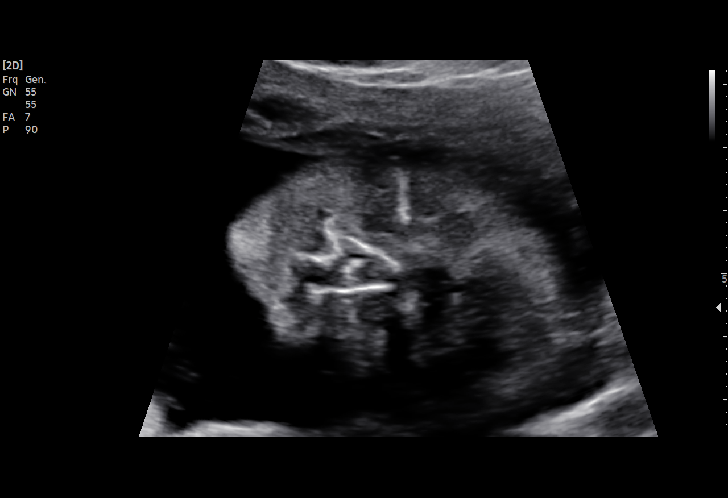
[im 15/95]
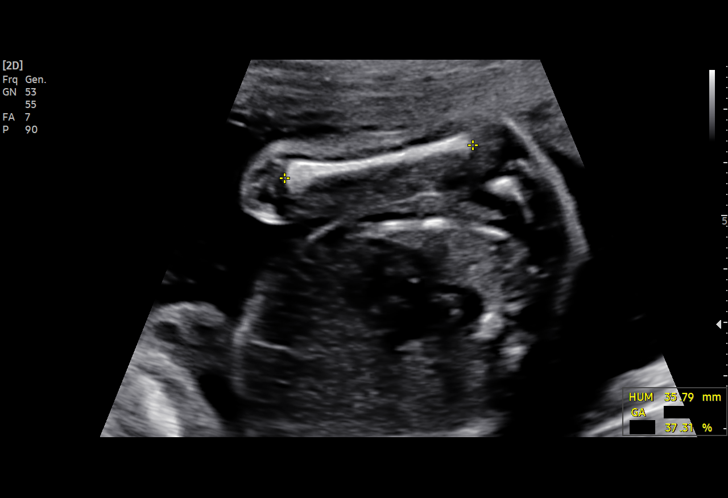
[im 22/95]
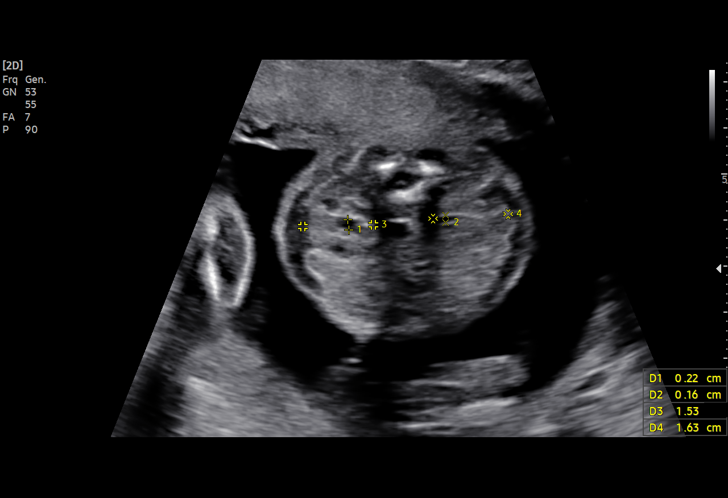
[im 29/95]
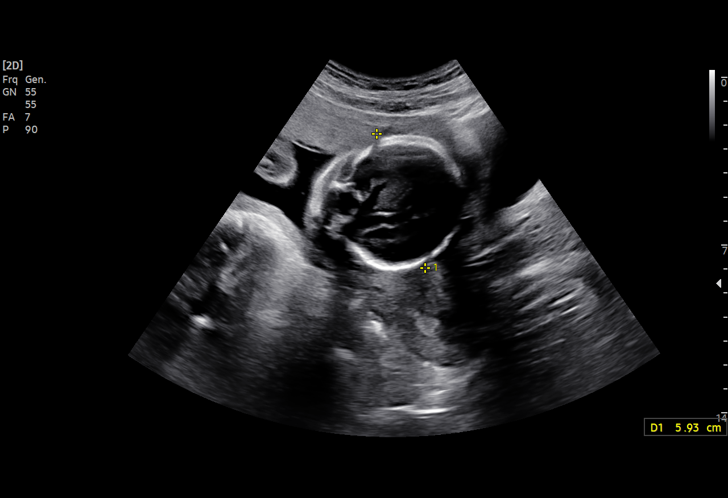
[im 37/95]
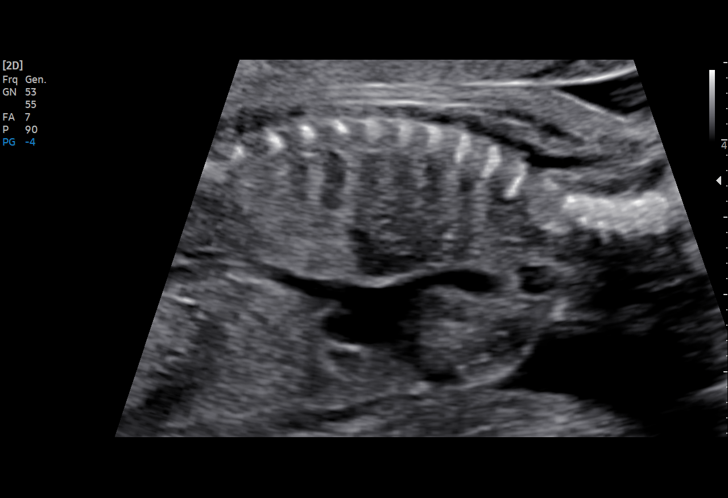
[im 44/95]
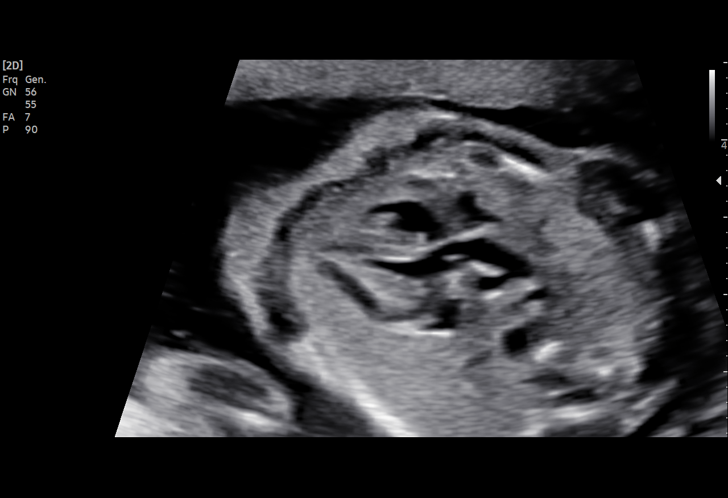
[im 51/95]
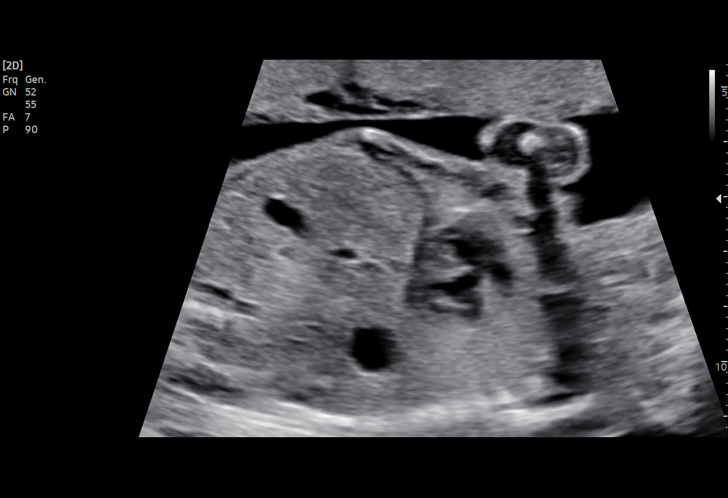
[im 55/95]
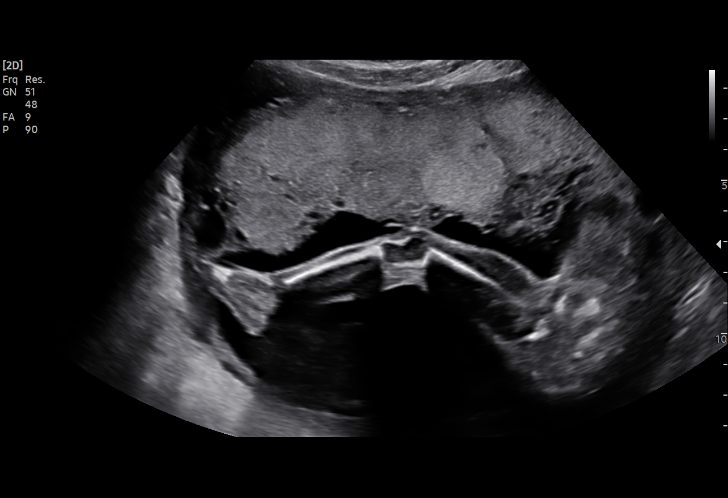
[im 62/95]
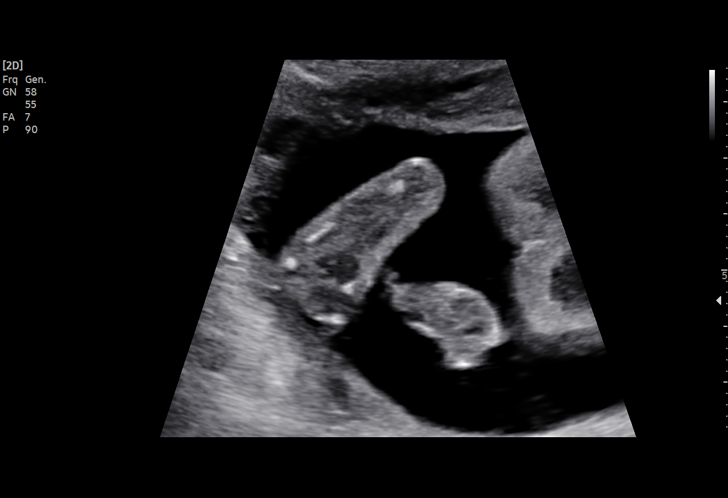
[im 69/95]
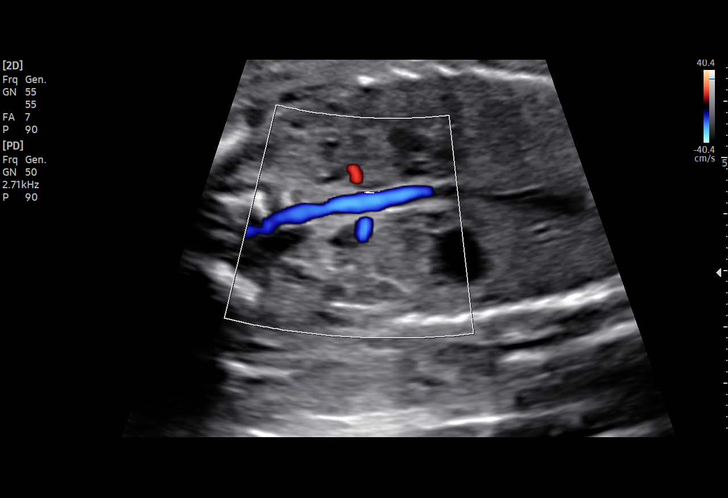
[im 76/95]
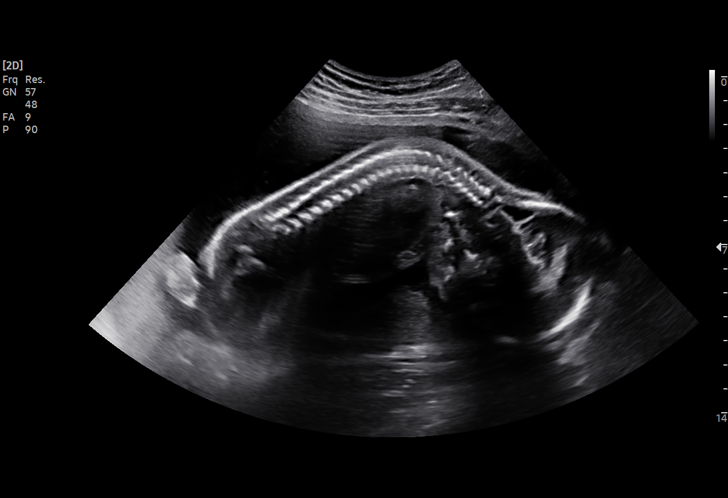
[im 84/95]
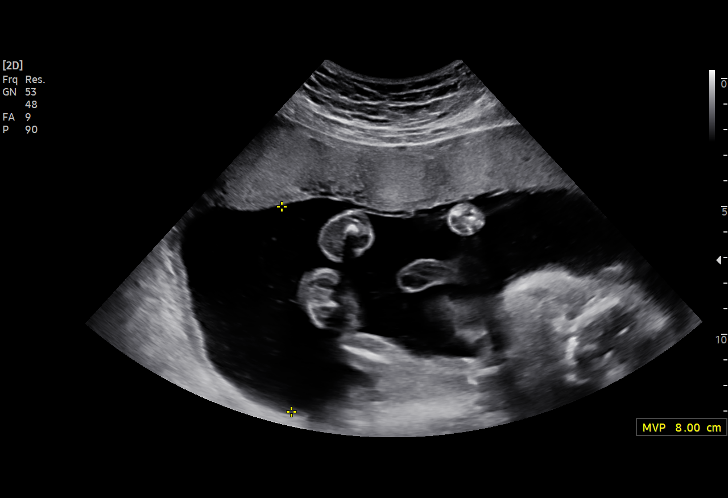
[im 91/95]
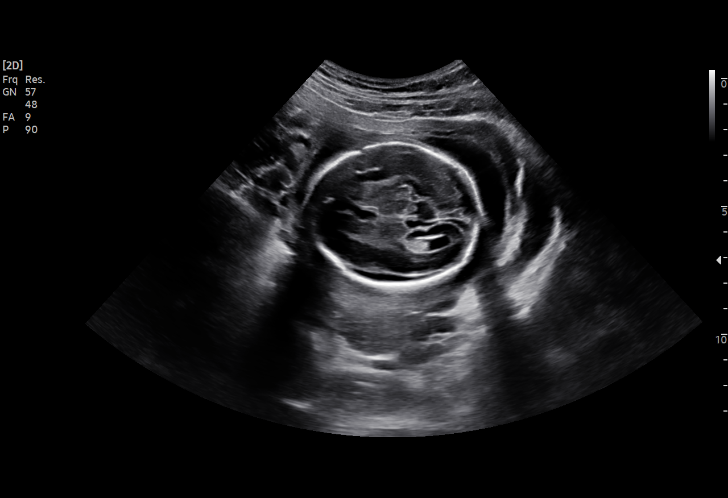

[Series 3: us ob comp + 14 wk · 1 of 1 slices shown (2 of 2)]
[im 1/1]
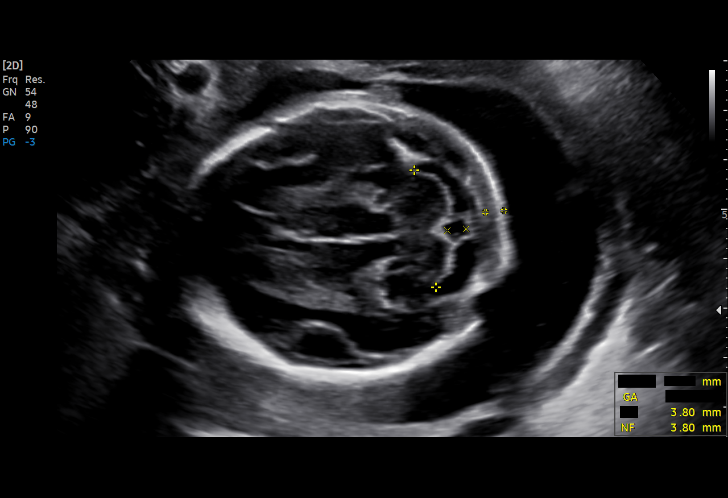

[15 of 28 positions shown; findings below may reference images not displayed]

FINDINGS: Number of Fetuses: 1

Heart Rate:  155 bpm

Movement: Yes

Presentation: Cephalic

Previa: No

Placental Location: Anterior

Amniotic Fluid (Subjective): Within normal limits

Amniotic Fluid (Objective):

Vertical pocket = 8.0cm

FETAL BIOMETRY

BPD: 5.5cm 22w 6d

HC:   20.3cm 22w 3d

AC:   17.5cm 22w 3d

FL:   3.8cm 22w 2d

Current Mean GA: 22w 3d US EDC: 09/04/2021

FETAL ANATOMY

Lateral Ventricles: Appears normal

Thalami/CSP: Appears normal

Posterior Fossa:  Appears normal

Nuchal Region: Appears normal   NFT= N/A > 20 WKS

Upper Lip: Appears normal

Spine: Appears normal

4 Chamber Heart on Left: Appears normal

LVOT: Appears normal

RVOT: Appears normal

Stomach on Left: Appears normal

3 Vessel Cord: Appears normal

Cord Insertion site: Appears normal

Kidneys: Appears normal

Bladder: Appears normal

Extremities: Appears normal

Maternal Findings:

Cervix:  4.1 cm TA
IMPRESSION: Single living IUP with estimated gestational age of 22 weeks 3 days,
and US EDC of 09/04/2021.

Unremarkable fetal anatomic survey.  No fetal anomalies identified.

## 2023-02-09 DIAGNOSIS — D229 Melanocytic nevi, unspecified: Secondary | ICD-10-CM | POA: Diagnosis not present

## 2023-02-09 DIAGNOSIS — D485 Neoplasm of uncertain behavior of skin: Secondary | ICD-10-CM | POA: Diagnosis not present

## 2023-02-16 ENCOUNTER — Telehealth: Payer: Self-pay

## 2023-02-16 NOTE — Telephone Encounter (Signed)

## 2023-02-23 ENCOUNTER — Encounter: Payer: Self-pay | Admitting: Certified Nurse Midwife

## 2023-02-23 ENCOUNTER — Ambulatory Visit (INDEPENDENT_AMBULATORY_CARE_PROVIDER_SITE_OTHER): Payer: BC Managed Care – PPO | Admitting: Certified Nurse Midwife

## 2023-02-23 DIAGNOSIS — Z30430 Encounter for insertion of intrauterine contraceptive device: Secondary | ICD-10-CM | POA: Diagnosis not present

## 2023-02-23 DIAGNOSIS — Z3202 Encounter for pregnancy test, result negative: Secondary | ICD-10-CM

## 2023-02-23 LAB — POCT URINE PREGNANCY: Preg Test, Ur: NEGATIVE

## 2023-02-23 MED ORDER — LEVONORGESTREL 20 MCG/DAY IU IUD
1.0000 | INTRAUTERINE_SYSTEM | Freq: Once | INTRAUTERINE | Status: AC
Start: 2023-02-23 — End: 2023-02-23
  Administered 2023-02-23: 1 via INTRAUTERINE

## 2023-02-23 NOTE — Addendum Note (Signed)
Addended by: Fonda Kinder on: 02/23/2023 10:14 AM   Modules accepted: Orders

## 2023-02-23 NOTE — Progress Notes (Signed)
Subjective:    Phyllis Jones is a 32 y.o. G63P2002 Caucasian female who presents for a postpartum visit. She is 6 weeks postpartum following a spontaneous vaginal delivery at 40 gestational weeks. Anesthesia: none. I have fully reviewed the prenatal and intrapartum course. Postpartum course has been normal. Baby's course has been normal. Baby is feeding by breast. Bleeding no bleeding. Bowel function is normal. Bladder function is normal. Patient is not sexually active.  Contraception method is IUD. Postpartum depression screening: negative. Score 1.  Last pap 07/28/2020 and was negative.  The following portions of the patient's history were reviewed and updated as appropriate: allergies, current medications, past medical history, past surgical history and problem list.  Review of Systems Pertinent items are noted in HPI.   Vitals:   02/23/23 0938  BP: 135/88  Pulse: 63  Weight: 131 lb 4.8 oz (59.6 kg)   No LMP recorded.  Objective:   General:  alert, cooperative and no distress   Breasts:  deferred, no complaints  Lungs: clear to auscultation bilaterally  Heart:  regular rate and rhythm  Abdomen: soft, nontender   Vulva: normal  Vagina: normal vagina  Cervix:  closed  Corpus: Well-involuted  Adnexa:  Non-palpable  Rectal Exam: no hemorrhoids        Assessment:   Postpartum exam 6 wks s/p SVD Breast feeding Depression screening Contraception counseling   Plan:  : IUD Follow up in: 5 months for annual/pap or earlier if needed  Doreene Burke, CNM

## 2023-02-23 NOTE — Patient Instructions (Addendum)
Preventive Care 21-30 Years Old, Female Preventive care refers to lifestyle choices and visits with your health care provider that can promote health and wellness. Preventive care visits are also called wellness exams. What can I expect for my preventive care visit? Counseling During your preventive care visit, your health care provider may ask about your: Medical history, including: Past medical problems. Family medical history. Pregnancy history. Current health, including: Menstrual cycle. Method of birth control. Emotional well-being. Home life and relationship well-being. Sexual activity and sexual health. Lifestyle, including: Alcohol, nicotine or tobacco, and drug use. Access to firearms. Diet, exercise, and sleep habits. Work and work environment. Sunscreen use. Safety issues such as seatbelt and bike helmet use. Physical exam Your health care provider may check your: Height and weight. These may be used to calculate your BMI (body mass index). BMI is a measurement that tells if you are at a healthy weight. Waist circumference. This measures the distance around your waistline. This measurement also tells if you are at a healthy weight and may help predict your risk of certain diseases, such as type 2 diabetes and high blood pressure. Heart rate and blood pressure. Body temperature. Skin for abnormal spots. What immunizations do I need?  Vaccines are usually given at various ages, according to a schedule. Your health care provider will recommend vaccines for you based on your age, medical history, and lifestyle or other factors, such as travel or where you work. What tests do I need? Screening Your health care provider may recommend screening tests for certain conditions. This may include: Pelvic exam and Pap test. Lipid and cholesterol levels. Diabetes screening. This is done by checking your blood sugar (glucose) after you have not eaten for a while (fasting). Hepatitis  B test. Hepatitis C test. HIV (human immunodeficiency virus) test. STI (sexually transmitted infection) testing, if you are at risk. BRCA-related cancer screening. This may be done if you have a family history of breast, ovarian, tubal, or peritoneal cancers. Talk with your health care provider about your test results, treatment options, and if necessary, the need for more tests. Follow these instructions at home: Eating and drinking  Eat a healthy diet that includes fresh fruits and vegetables, whole grains, lean protein, and low-fat dairy products. Take vitamin and mineral supplements as recommended by your health care provider. Do not drink alcohol if: Your health care provider tells you not to drink. You are pregnant, may be pregnant, or are planning to become pregnant. If you drink alcohol: Limit how much you have to 0-1 drink a day. Know how much alcohol is in your drink. In the [place] one drink equals one 12 oz bottle of beer (355 mL), one 5 oz glass of wine (148 mL), or one 1 oz glass of hard liquor (44 mL). Lifestyle Brush your teeth every morning and night with fluoride toothpaste. Floss one time each day. Exercise for at least 30 minutes 5 or more days each week. Do not use any products that contain nicotine or tobacco. These products include cigarettes, chewing tobacco, and vaping devices, such as e-cigarettes. If you need help quitting, ask your health care provider. Do not use drugs. If you are sexually active, practice safe sex. Use a condom or other form of protection to prevent STIs. If you do not wish to become pregnant, use a form of birth control. If you plan to become pregnant, see your health care provider for a prepregnancy visit. Find healthy ways to manage stress, such as: Meditation,   yoga, or listening to music. Journaling. Talking to a trusted person. Spending time with friends and family. Minimize exposure to UV radiation to reduce your risk of skin  cancer. Safety Always wear your seat belt while driving or riding in a vehicle. Do not drive: If you have been drinking alcohol. Do not ride with someone who has been drinking. If you have been using any mind-altering substances or drugs. While texting. When you are tired or distracted. Wear a helmet and other protective equipment during sports activities. If you have firearms in your house, make sure you follow all gun safety procedures. Seek help if you have been physically or sexually abused. What's next? Go to your health care provider once a year for an annual wellness visit. Ask your health care provider how often you should have your eyes and teeth checked. Stay up to date on all vaccines. This information is not intended to replace advice given to you by your health care provider. Make sure you discuss any questions you have with your health care provider. Document Revised: 02/25/2021 Document Reviewed: 02/25/2021 Elsevier Patient Education  2024 Elsevier Inc. IUD PLACEMENT POST-PROCEDURE INSTRUCTIONS  You may take Ibuprofen, Aleve or Tylenol for pain if needed.  Cramping should resolve within in 24 hours.  You may have a small amount of spotting.  You should wear a mini pad for the next few days.  You may have intercourse after 24 hours.  If you using this for birth control, it is effective immediately.  You need to call if you have any pelvic pain, fever, heavy bleeding or foul smelling vaginal discharge.  Irregular bleeding is common the first several months after having an IUD placed. You do not need to call for this reason unless you are concerned.  Shower or bathe as normal  You should have a follow-up appointment in 4-8 weeks for a re-check to make sure you are not having any problems.

## 2023-02-23 NOTE — Progress Notes (Signed)
   GYNECOLOGY OFFICE PROCEDURE NOTE  Phyllis Jones is a 32 y.o. G3P2002 here for Mirena IUD insertion. No GYN concerns.  Last pap smear was on 07/28/2020 and was normal.  IUD Insertion Procedure Note Patient identified, informed consent performed, consent signed.   Discussed risks of irregular bleeding, cramping, infection, malpositioning or misplacement of the IUD outside the uterus which may require further procedure such as laparoscopy. Also discussed >99% contraception efficacy, increased risk of ectopic pregnancy with failure of method.   Emphasized that this did not protect against STIs, condoms recommended during all sexual encounters. Time out was performed.  Urine pregnancy test negative.  Speculum placed in the vagina.  Cervix visualized.  Cleaned with Betadine x 2.  Grasped anteriorly with a single tooth tenaculum.  Uterus sounded to 9 cm.  mirena IUD placed per manufacturer's recommendations.  Strings trimmed to 3 cm. Tenaculum was removed, good hemostasis noted.  Patient tolerated procedure well.   Patient was given post-procedure instructions.  She was advised to have backup contraception for one week.  Patient was also asked to check IUD strings periodically and follow up in 4 weeks for IUD check.   Doreene Burke, CNM

## 2023-02-24 DIAGNOSIS — D225 Melanocytic nevi of trunk: Secondary | ICD-10-CM | POA: Diagnosis not present

## 2023-02-24 DIAGNOSIS — D235 Other benign neoplasm of skin of trunk: Secondary | ICD-10-CM | POA: Diagnosis not present

## 2023-03-18 ENCOUNTER — Encounter: Payer: Self-pay | Admitting: Certified Nurse Midwife

## 2023-03-18 ENCOUNTER — Ambulatory Visit (INDEPENDENT_AMBULATORY_CARE_PROVIDER_SITE_OTHER): Payer: BC Managed Care – PPO | Admitting: Certified Nurse Midwife

## 2023-03-18 VITALS — BP 131/83 | HR 74 | Wt 129.8 lb

## 2023-03-18 DIAGNOSIS — Z30431 Encounter for routine checking of intrauterine contraceptive device: Secondary | ICD-10-CM | POA: Diagnosis not present

## 2023-03-18 DIAGNOSIS — K921 Melena: Secondary | ICD-10-CM

## 2023-03-18 NOTE — Progress Notes (Signed)
       GYNECOLOGY OFFICE ENCOUNTER NOTE  History:  32 y.o. [id number] here today for today for IUD string check; Mirena  IUD was placed  . No complaints about the IUD, no concerning side effects.  The following portions of the patient's history were reviewed and updated as appropriate: allergies, current medications, past family history, past medical history, past social history, past surgical history and problem list. Last pap smear on 07/28/2020 was normal..  Review of Systems:  Pertinent items are noted in HPI.   Objective:  Physical Exam Blood pressure 131/83, pulse 74, weight 129 lb 12.8 oz (58.9 kg), currently breastfeeding. CONSTITUTIONAL: Well-developed, well-nourished female in no acute distress.  NEUROLOGIC: Alert and oriented to person, place, and time. Normal reflexes, muscle tone coordination.  PSYCHIATRIC: Normal mood and affect. Normal behavior. Normal judgment and thought content. CARDIOVASCULAR: Normal heart rate noted RESPIRATORY: Effort and breath sounds normal, no problems with respiration noted ABDOMEN: Soft, no distention noted.   PELVIC: Normal appearing external genitalia; normal appearing vaginal mucosa and cervix.  IUD strings visualized, about 3 cm in length outside cervix. Done in the presence of a chaperone.   Assessment & Plan:  Patient to keep IUD in place for up to eight years; can come in for removal earlier if she desires or for any concerning side effects. Recommended condoms for STI prevention.  Pt state she has had some blood with stool. On exam no hemorrhoids seen. State she has family history of colon issues. Request referral GI. Orders placed.   Doreene Burke, CNM

## 2023-03-28 ENCOUNTER — Telehealth: Payer: Self-pay

## 2023-03-28 NOTE — Telephone Encounter (Signed)
Called the patient back to schedule and appointment with one of our provider. I checked to make sure patient is not established with no other GI. Patient is not currently established with GI.

## 2023-05-01 ENCOUNTER — Encounter: Payer: Self-pay | Admitting: Certified Nurse Midwife

## 2023-05-06 ENCOUNTER — Ambulatory Visit: Payer: BC Managed Care – PPO | Admitting: Licensed Practical Nurse

## 2023-05-06 ENCOUNTER — Encounter: Payer: Self-pay | Admitting: Licensed Practical Nurse

## 2023-05-06 VITALS — BP 130/81 | HR 63 | Wt 130.8 lb

## 2023-05-06 DIAGNOSIS — Z30431 Encounter for routine checking of intrauterine contraceptive device: Secondary | ICD-10-CM | POA: Diagnosis not present

## 2023-05-06 NOTE — Progress Notes (Signed)
    GYNECOLOGY OFFICE ENCOUNTER NOTE  History:  32 y.o. [id number] here today for today for IUD string check; Mirena  IUD was placed  03/18/2023. Is pleased with IUD but her partner does feel it during IC.  The following portions of the patient's history were reviewed and updated as appropriate: allergies, current medications, past family history, past medical history, past social history, past surgical history and problem list. Last pap smear on 07/2020 was normal, negative HRHPV.  Review of Systems:  Pertinent items are noted in HPI.  Objective:  Physical Exam Blood pressure 130/81, pulse 63, weight 130 lb 12.8 oz (59.3 kg), currently breastfeeding. CONSTITUTIONAL: Well-developed, well-nourished female in no acute distress.  NEUROLOGIC: Alert and oriented to person, place, and time. Normal reflexes, muscle tone coordination.  ABDOMEN: Soft, no distention noted.   PELVIC: Normal appearing external genitalia; normal appearing vaginal mucosa and cervix.  IUD strings visualized, about 3 cm in length outside cervix. Trimmed to about 2cm.  EXTREMITIES: Non-tender, no edema or cyanosis  Assessment & Plan:  Patient to keep IUD in place for up to 8 years; can come in for removal if she desires pregnancy earlier or for any concerning side effects.    Olliver Boyadjian, Courtney Heys, CNM

## 2023-05-11 ENCOUNTER — Ambulatory Visit (INDEPENDENT_AMBULATORY_CARE_PROVIDER_SITE_OTHER): Payer: BC Managed Care – PPO | Admitting: Internal Medicine

## 2023-05-11 ENCOUNTER — Encounter: Payer: Self-pay | Admitting: Internal Medicine

## 2023-05-11 VITALS — BP 114/78 | HR 91 | Temp 97.6°F | Resp 16 | Ht 62.75 in | Wt 129.5 lb

## 2023-05-11 DIAGNOSIS — E876 Hypokalemia: Secondary | ICD-10-CM | POA: Diagnosis not present

## 2023-05-11 DIAGNOSIS — Z13220 Encounter for screening for lipoid disorders: Secondary | ICD-10-CM

## 2023-05-11 DIAGNOSIS — D509 Iron deficiency anemia, unspecified: Secondary | ICD-10-CM

## 2023-05-11 NOTE — Progress Notes (Signed)
New Patient Office Visit  Subjective    Patient ID: Phyllis Jones, female    DOB: 1991-08-31  Age: 32 y.o. MRN: [id number]  CC:  Chief Complaint  Patient presents with   Establish Care    HPI Phyllis Jones presents to establish care. She has been following with her Gynecologist for routine labs and physicals but is ready to establish with a PCP.   Iron Deficiency Anemia: -Last labs were in the spring when she was postpartum, hgb was 8.7 -She does have a history of requiring blood transfusions in the past -Not currently on iron supplement -Not having periods currently - had a Mirena IUD placed in June 2024  Health Maintenance: -Blood work due -Pap at Gynecology, planning on completing later this year  Outpatient Encounter Medications as of 05/11/2023  Medication Sig   Prenatal Vit-Fe Fumarate-FA (MULTIVITAMIN-PRENATAL) 27-0.8 MG TABS tablet Take 1 tablet by mouth daily at 12 noon.   ferrous sulfate 325 (65 FE) MG tablet Take 1 tablet (325 mg total) by mouth 2 (two) times daily with a meal. (Patient not taking: Reported on 05/11/2023)   No facility-administered encounter medications on file as of 05/11/2023.    Past Medical History:  Diagnosis Date   Anemia     Past Surgical History:  Procedure Laterality Date   TONSILLECTOMY  2004    Family History  Problem Relation Age of Onset   Cancer Maternal Grandfather 49       lung   Cancer Paternal Grandmother 41       uterine   Asthma Paternal Grandfather    Breast cancer Neg Hx    Ovarian cancer Neg Hx    Colon cancer Neg Hx     Social History   Socioeconomic History   Marital status: Married    Spouse name: Gaynell Face   Number of children: 2   Years of education: 20   Highest education level: Not on file  Occupational History   Occupation: physical therapist  Tobacco Use   Smoking status: Never   Smokeless tobacco: Never  Vaping Use   Vaping status: Never Used  Substance and Sexual Activity   Alcohol use:  Not Currently    Comment: rarely   Drug use: Never   Sexual activity: Yes    Partners: Male    Birth control/protection: I.U.D.    Comment: undecided  Other Topics Concern   Not on file  Social History Narrative   Not on file   Social Determinants of Health   Financial Resource Strain: Low Risk  (05/11/2023)   Overall Financial Resource Strain (CARDIA)    Difficulty of Paying Living Expenses: Not hard at all  Food Insecurity: No Food Insecurity (01/08/2023)   Hunger Vital Sign    Worried About Running Out of Food in the Last Year: Never true    Ran Out of Food in the Last Year: Never true  Transportation Needs: No Transportation Needs (01/08/2023)   PRAPARE - Administrator, Civil Service (Medical): No    Lack of Transportation (Non-Medical): No  Physical Activity: Insufficiently Active (05/11/2023)   Exercise Vital Sign    Days of Exercise per Week: 2 days    Minutes of Exercise per Session: 30 min  Stress: No Stress Concern Present (05/11/2023)   [employer] of Occupational Health - Occupational Stress Questionnaire    Feeling of Stress : Only a little  Social Connections: Moderately Integrated (05/11/2023)   Social Connection and Isolation Panel [NHANES]  Frequency of Communication with Friends and Family: More than three times a week    Frequency of Social Gatherings with Friends and Family: More than three times a week    Attends Religious Services: More than 4 times per year    Active Member of [employer] or Organizations: No    Attends Banker Meetings: Never    Marital Status: Married  Catering manager Violence: Not At Risk (01/08/2023)   Humiliation, Afraid, Rape, and Kick questionnaire    Fear of Current or Ex-Partner: No    Emotionally Abused: No    Physically Abused: No    Sexually Abused: No    Review of Systems  Constitutional:  Negative for chills, fever and malaise/fatigue.        Objective    BP 114/78   Pulse 91   Temp  97.6 F (36.4 C) (Oral)   Resp 16   Ht 5' 2.75" (1.594 m)   Wt 129 lb 8 oz (58.7 kg)   SpO2 99%   Breastfeeding Yes   BMI 23.12 kg/m   Physical Exam Constitutional:      Appearance: Normal appearance.  HENT:     Head: Normocephalic and atraumatic.     Mouth/Throat:     Mouth: Mucous membranes are moist.     Pharynx: Oropharynx is clear.  Eyes:     Extraocular Movements: Extraocular movements intact.     Conjunctiva/sclera: Conjunctivae normal.     Pupils: Pupils are equal, round, and reactive to light.  Neck:     Comments: No thyromegaly  Cardiovascular:     Rate and Rhythm: Normal rate and regular rhythm.  Pulmonary:     Effort: Pulmonary effort is normal.     Breath sounds: Normal breath sounds.  Musculoskeletal:     Cervical back: No tenderness.     Right lower leg: No edema.     Left lower leg: No edema.  Lymphadenopathy:     Cervical: No cervical adenopathy.  Skin:    General: Skin is warm and dry.  Neurological:     General: No focal deficit present.     Mental Status: She is alert. Mental status is at baseline.  Psychiatric:        Mood and Affect: Mood normal.        Behavior: Behavior normal.         Assessment & Plan:   1. Iron deficiency anemia, unspecified iron deficiency anemia type: Recheck hemoglobin and ferritin today, not currently on iron supplements but discussed restarting. Recommend taking with a stool softener, especially because patient had an incidence of blood in the stools postpartum. This has resolved and she has an appointment with GI next month.   - CBC w/Diff/Platelet - Fe+TIBC+Fer  2. Hypokalemia: Recheck labs.   - COMPLETE METABOLIC PANEL WITH GFR  3. Lipid screening: Screening labs due, patient is fasting.   - Lipid Profile   Return in about 1 year (around 05/10/2024).   Margarita Mail, DO

## 2023-05-12 LAB — IRON,TIBC AND FERRITIN PANEL
%SAT: 11 % — ABNORMAL LOW (ref 16–45)
Ferritin: 4 ng/mL — ABNORMAL LOW (ref 16–154)
Iron: 46 ug/dL (ref 40–190)
TIBC: 424 ug/dL (ref 250–450)

## 2023-05-12 LAB — COMPLETE METABOLIC PANEL WITH GFR
AG Ratio: 1.7 (calc) (ref 1.0–2.5)
ALT: 24 U/L (ref 6–29)
AST: 19 U/L (ref 10–30)
Albumin: 4.8 g/dL (ref 3.6–5.1)
Alkaline phosphatase (APISO): 76 U/L (ref 31–125)
BUN: 20 mg/dL (ref 7–25)
CO2: 25 mmol/L (ref 20–32)
Calcium: 9.6 mg/dL (ref 8.6–10.2)
Chloride: 104 mmol/L (ref 98–110)
Creat: 0.79 mg/dL (ref 0.50–0.97)
Globulin: 2.9 g/dL (ref 1.9–3.7)
Glucose, Bld: 93 mg/dL (ref 65–99)
Potassium: 4.2 mmol/L (ref 3.5–5.3)
Sodium: 138 mmol/L (ref 135–146)
Total Bilirubin: 0.5 mg/dL (ref 0.2–1.2)
Total Protein: 7.7 g/dL (ref 6.1–8.1)
eGFR: 103 mL/min/{1.73_m2} (ref >=60)

## 2023-05-12 LAB — LIPID PANEL
Cholesterol: 211 mg/dL — ABNORMAL HIGH (ref <200)
HDL: 48 mg/dL — ABNORMAL LOW (ref >=50)
LDL Cholesterol (Calc): 147 mg/dL — ABNORMAL HIGH
Non-HDL Cholesterol (Calc): 163 mg/dL — ABNORMAL HIGH (ref <130)
Total CHOL/HDL Ratio: 4.4 (calc) (ref <5.0)
Triglycerides: 64 mg/dL (ref <150)

## 2023-05-12 LAB — CBC WITH DIFFERENTIAL/PLATELET
Absolute Monocytes: 506 {cells}/uL (ref 200–950)
Basophils Absolute: 49 {cells}/uL (ref 0–200)
Basophils Relative: 0.8 %
Eosinophils Absolute: 244 {cells}/uL (ref 15–500)
Eosinophils Relative: 4 %
HCT: 39.1 % (ref 35.0–45.0)
Hemoglobin: 13 g/dL (ref 11.7–15.5)
Lymphs Abs: 2117 {cells}/uL (ref 850–3900)
MCH: 26.6 pg — ABNORMAL LOW (ref 27.0–33.0)
MCHC: 33.2 g/dL (ref 32.0–36.0)
MCV: 80 fL (ref 80.0–100.0)
MPV: 10.1 fL (ref 7.5–12.5)
Monocytes Relative: 8.3 %
Neutro Abs: 3184 {cells}/uL (ref 1500–7800)
Neutrophils Relative %: 52.2 %
Platelets: 317 10*3/uL (ref 140–400)
RBC: 4.89 10*6/uL (ref 3.80–5.10)
RDW: 14.2 % (ref 11.0–15.0)
Total Lymphocyte: 34.7 %
WBC: 6.1 10*3/uL (ref 3.8–10.8)

## 2023-06-02 ENCOUNTER — Telehealth: Payer: Self-pay

## 2023-06-02 NOTE — Telephone Encounter (Signed)
Per voice mail message pt want to cancel appt at this time. Will call at a later time if she wants to r/s.

## 2023-06-07 ENCOUNTER — Ambulatory Visit: Payer: BC Managed Care – PPO | Admitting: Gastroenterology

## 2023-06-30 DIAGNOSIS — R519 Headache, unspecified: Secondary | ICD-10-CM | POA: Diagnosis not present

## 2023-06-30 DIAGNOSIS — M542 Cervicalgia: Secondary | ICD-10-CM | POA: Diagnosis not present

## 2023-07-05 DIAGNOSIS — M542 Cervicalgia: Secondary | ICD-10-CM | POA: Diagnosis not present

## 2023-07-05 DIAGNOSIS — R519 Headache, unspecified: Secondary | ICD-10-CM | POA: Diagnosis not present

## 2023-08-01 ENCOUNTER — Ambulatory Visit: Payer: BC Managed Care – PPO | Admitting: Internal Medicine

## 2023-08-01 NOTE — Progress Notes (Deleted)
   Acute Office Visit  Subjective:     Patient ID: Phyllis Jones, female    DOB: August 25, 1991, 32 y.o.   MRN: [id number]  No chief complaint on file.   HPI Patient is in today for headaches.   ROS      Objective:    There were no vitals taken for this visit. {Vitals History (Optional):23777}  Physical Exam  No results found for any visits on 08/01/23.      Assessment & Plan:   Problem List Items Addressed This Visit   None   No orders of the defined types were placed in this encounter.   No follow-ups on file.  Margarita Mail, DO

## 2023-08-25 ENCOUNTER — Encounter: Payer: Self-pay | Admitting: Obstetrics

## 2023-08-25 ENCOUNTER — Ambulatory Visit (INDEPENDENT_AMBULATORY_CARE_PROVIDER_SITE_OTHER): Payer: BC Managed Care – PPO | Admitting: Obstetrics

## 2023-08-25 ENCOUNTER — Other Ambulatory Visit (HOSPITAL_COMMUNITY)
Admission: RE | Admit: 2023-08-25 | Discharge: 2023-08-25 | Disposition: A | Discharge status: Home / Self Care | Payer: BC Managed Care – PPO | Source: Ambulatory Visit | Attending: Obstetrics | Admitting: Obstetrics

## 2023-08-25 VITALS — BP 131/90 | HR 67 | Ht 63.0 in | Wt 127.8 lb

## 2023-08-25 DIAGNOSIS — Z01419 Encounter for gynecological examination (general) (routine) without abnormal findings: Secondary | ICD-10-CM

## 2023-08-25 DIAGNOSIS — Z124 Encounter for screening for malignant neoplasm of cervix: Secondary | ICD-10-CM

## 2023-08-25 DIAGNOSIS — L2089 Other atopic dermatitis: Secondary | ICD-10-CM | POA: Diagnosis not present

## 2023-08-25 NOTE — Progress Notes (Signed)
ANNUAL GYNECOLOGICAL EXAM  SUBJECTIVE  HPI  Phyllis Jones is a 32 y.o.-year-old G3P2002 who presents for an annual gynecological exam today.  She denies pelvic pain, dyspareunia, abnormal vaginal bleeding or discharge, and UTI symptoms. She has a Mirena IUD for contraception and does not get a period. She has no health concerns today.  Medical/Surgical History Past Medical History:  Diagnosis Date   Anemia    Past Surgical History:  Procedure Laterality Date   TONSILLECTOMY  2004    Social History Lives with husband and 3 kids (4, 2, and 7 months). 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 (Menstrual status: IUD). Last Pap: 07/28/20 - NILM Contraception: IUD  Prevention Dentist Eye exam Mammogram: at 42 Colonoscopy: at 47 Flu shot/vaccines: decline flu shot  Current Medications Outpatient Medications Prior to Visit  Medication Sig   ferrous sulfate 325 (65 FE) MG tablet Take 1 tablet (325 mg total) by mouth 2 (two) times daily with a meal. (Patient not taking: Reported on 08/25/2023)   Prenatal Vit-Fe Fumarate-FA (MULTIVITAMIN-PRENATAL) 27-0.8 MG TABS tablet Take 1 tablet by mouth daily at 12 noon. (Patient not taking: Reported on 08/25/2023)   No facility-administered medications prior to visit.      The pregnancy intention screening data noted above was reviewed. Potential methods of contraception were discussed. The patient elected to proceed with No data recorded.   ROS Constitutional: Denied constitutional symptoms, night sweats, recent illness, fatigue, fever, insomnia and weight loss.  Eyes: Denied eye symptoms, eye pain, photophobia,  vision change and visual disturbance.  Ears/Nose/Throat/Neck: Denied ear, nose, throat or neck symptoms, hearing loss, nasal discharge, sinus congestion and sore throat.  Cardiovascular: Denied cardiovascular symptoms, arrhythmia, chest pain/pressure, edema, exercise intolerance, orthopnea and palpitations.  Respiratory: Denied pulmonary symptoms, asthma, pleuritic pain, productive sputum, cough, dyspnea and wheezing.  Gastrointestinal: Denied, gastro-esophageal reflux, melena, nausea and vomiting.  Genitourinary: Denied genitourinary symptoms including symptomatic vaginal discharge, pelvic relaxation issues, and urinary complaints.  Musculoskeletal: Denied musculoskeletal symptoms, stiffness, swelling, muscle weakness and myalgia.  Dermatologic: Denied dermatology symptoms, rash and scar.  Neurologic: Denied neurology symptoms, dizziness, headache, neck pain and syncope.  Psychiatric: Denied psychiatric symptoms, anxiety and depression.  Endocrine: Denied endocrine symptoms including hot flashes and night sweats.    OBJECTIVE  BP (!) 131/90   Pulse 67   Ht 5\' 3"  (1.6 m)   Wt 127 lb 12.8 oz (58 kg)   Breastfeeding No   BMI 22.64 kg/m    Physical examination General NAD, Conversant  HEENT Atraumatic; Op clear with mmm.  Normo-cephalic. Pupils reactive. Anicteric sclerae  Thyroid/Neck Smooth without nodularity or enlargement. Normal ROM.  Neck Supple.  Skin No rashes, lesions or ulceration. Normal palpated skin turgor. No nodularity.  Breasts: No masses or discharge.  Symmetric.  No axillary adenopathy.  Lungs: Clear to auscultation.No rales or wheezes. Normal Respiratory effort, no retractions.  Heart: NSR.  No murmurs or rubs appreciated. No  peripheral edema  Abdomen: Soft.  Non-tender.  No masses.  No HSM. No hernia  Extremities: Moves all appropriately.  Normal ROM for age. No lymphadenopathy.  Neuro: Oriented to PPT.  Normal mood. Normal affect.     Pelvic:   Vulva: Normal  appearance.  No lesions.  Vagina: No lesions or abnormalities noted.  Support: Normal pelvic support.  Urethra No masses tenderness or scarring.  Meatus Normal size without lesions or prolapse.  Cervix: Normal appearance.  No lesions. Ectropion noted. IUD strings visible. Pap collected.  Anus: Normal exam.  No lesions.  Perineum: Normal exam.  No lesions.    ASSESSMENT  1) Annual exam 2) Due for Pap.  PLAN 1) Physical exam as noted. Discussed healthy lifestyle choices and preventive care. Declines STI testing. Routine labs done with PCP. 2) Pap collected. F/u based on results. Discussed ASCCP recommendations for frequency of Paps.   Return in one year for annual exam or as needed for concerns.   Guadlupe Spanish, CNM

## 2023-08-29 ENCOUNTER — Ambulatory Visit: Payer: BC Managed Care – PPO | Admitting: Certified Nurse Midwife

## 2023-09-01 ENCOUNTER — Other Ambulatory Visit: Payer: Self-pay

## 2023-09-01 ENCOUNTER — Ambulatory Visit (INDEPENDENT_AMBULATORY_CARE_PROVIDER_SITE_OTHER): Payer: BC Managed Care – PPO | Admitting: Gastroenterology

## 2023-09-01 ENCOUNTER — Encounter: Payer: Self-pay | Admitting: Gastroenterology

## 2023-09-01 VITALS — BP 152/98 | HR 75 | Temp 98.3°F | Ht 63.0 in | Wt 129.0 lb

## 2023-09-01 DIAGNOSIS — D508 Other iron deficiency anemias: Secondary | ICD-10-CM

## 2023-09-01 DIAGNOSIS — K921 Melena: Secondary | ICD-10-CM | POA: Diagnosis not present

## 2023-09-01 LAB — CYTOLOGY - PAP
Comment: NEGATIVE
Diagnosis: NEGATIVE
High risk HPV: NEGATIVE

## 2023-09-01 MED ORDER — NA SULFATE-K SULFATE-MG SULF 17.5-3.13-1.6 GM/177ML PO SOLN: 354.0000 mL | Freq: Once | ORAL | 0 refills | Status: AC

## 2023-09-01 NOTE — Patient Instructions (Addendum)
Please let us know if you do not need to have a hemorrhoid banding after you have your colonoscopy. Please call us at [phone] to reschedule or cancel.  Hemorrhoids Hemorrhoids are swollen veins that may form: In the butt (rectum). These are called internal hemorrhoids. Around the opening of the butt (anus). These are called external hemorrhoids. Most hemorrhoids do not cause very bad problems. They often get better with changes to your lifestyle and what you eat. What are the causes? Having trouble pooping (constipation) or watery poop (diarrhea). Pushing too hard when you poop. Pregnancy. Being very overweight (obese). Sitting for too long. Riding a bike for a long time. Heavy lifting or other things that take a lot of effort. Anal sex. What are the signs or symptoms? Pain. Itching or soreness in the butt. Bleeding from the butt. Leaking poop. Swelling. One or more lumps around the opening of your butt. How is this treated? In most cases, hemorrhoids can be treated at home. You may be told to: Change what you eat. Make changes to your lifestyle. If these treatments do not help, you may need to have a procedure done. Your doctor may need to: Place rubber bands at the bottom of the hemorrhoids to make them fall off. Put medicine into the hemorrhoids to shrink them. Shine a type of light on the hemorrhoids to cause them to fall off. Do surgery to get rid of the hemorrhoids. Follow these instructions at home: Medicines Take over-the-counter and prescription medicines only as told by your doctor. Use creams with medicine in them or medicines that you put in your butt as told by your doctor. Eating and drinking  Eat foods that have a lot of fiber in them. These include whole grains, beans, nuts, fruits, and vegetables. Ask your doctor about taking products that have fiber added to them (fibersupplements). Take in less fat. You can do this by: Eating low-fat dairy  products. Eating less red meat. Staying away from processed foods. Drink enough fluid to keep your pee (urine) pale yellow. Managing pain and swelling  Take a warm-water bath (sitz bath) for 20 minutes to ease pain. Do this 3-4 times a day. You may do this in a bathtub. You may also use a portable sitz bath that fits over the toilet. If told, put ice on the painful area. It may help to use ice between your warm baths. Put ice in a plastic bag. Place a towel between your skin and the bag. Leave the ice on for 20 minutes, 2-3 times a day. If your skin turns bright red, take off the ice right away to prevent skin damage. The risk of damage is higher if you cannot feel pain, heat, or cold. General instructions Exercise. Ask your doctor how much and what kind of exercise is best for you. Go to the bathroom when you need to poop. Do not wait. Try not to push too hard when you poop. Keep your butt dry and clean. Use wet toilet paper or moist towelettes after you poop. Do not sit on the toilet for a long time. Contact a doctor if: You have pain and swelling that do not get better with treatment. You have trouble pooping. You cannot poop. You have pain or swelling outside the area of the hemorrhoids. Get help right away if: You have bleeding from the butt that will not stop. This information is not intended to replace advice given to you by your health care provider. Make sure you  discuss any questions you have with your health care provider. Document Revised: 05/12/2022 Document Reviewed: 05/12/2022 Elsevier Patient Education  2024 [employer].

## 2023-09-01 NOTE — Progress Notes (Signed)
Wyline Mood MD, MRCP(U.K) [address]  [address]  [place], [place] [address]  Main: [phone]  Fax: [phone]   Gastroenterology Consultation  Referring Provider:     Margarita Mail, DO Primary Care Physician:  Margarita Mail, DO Primary Gastroenterologist:  Dr. Wyline Mood  Reason for Consultation:   Blood in stool  HPI:   Phyllis Jones is a 32 y.o. y/o female referred for consultation & management  by Dr. Margarita Mail, DO.    She says that after the delivery of her child she noticed blood on the tissue paper on and off.  Stopped for a while then recurred.  Initially was associated with sharp pain during the process of defecation hard stool but since then has not had any such issues.  Recently November had another episode of painless blood on the tissue paper and on the stool.  No family history of colon cancer no change in the shape of her stool no unintentional weight loss.  No other complaints.  Denies any regular use of NSAIDs has used some in the past.  No family history of celiac disease.  She has been referred for blood in stool. 05/11/2023 hemoglobin 13 g MCV of 80 ferritin of 4 creatinine of 0.79 Past Medical History:  Diagnosis Date   Anemia     Past Surgical History:  Procedure Laterality Date   TONSILLECTOMY  2004    Prior to Admission medications   Medication Sig Start Date End Date Taking? Authorizing Provider  ferrous sulfate 325 (65 FE) MG tablet Take 1 tablet (325 mg total) by mouth 2 (two) times daily with a meal. Patient not taking: Reported on 08/25/2023 10/12/22   Linzie Collin, MD  Prenatal Vit-Fe Fumarate-FA (MULTIVITAMIN-PRENATAL) 27-0.8 MG TABS tablet Take 1 tablet by mouth daily at 12 noon. Patient not taking: Reported on 08/25/2023    [provider]    Family History  Problem Relation Age of Onset   Cancer Maternal Grandfather 19       lung   Cancer Paternal Grandmother 98       uterine   Asthma Paternal  Grandfather    Breast cancer Neg Hx    Ovarian cancer Neg Hx    Colon cancer Neg Hx      Social History   Tobacco Use   Smoking status: Never   Smokeless tobacco: Never  Vaping Use   Vaping status: Never Used  Substance Use Topics   Alcohol use: Not Currently    Comment: rarely   Drug use: Never    Allergies as of 09/01/2023   (No Known Allergies)    Review of Systems:    All systems reviewed and negative except where noted in HPI.   Physical Exam:  BP (!) 152/98   Pulse 75   Temp 98.3 F (36.8 C) (Oral)   Ht 5\' 3"  (1.6 m)   Wt 129 lb (58.5 kg)   BMI 22.85 kg/m  No LMP recorded. (Menstrual status: IUD). Psych:  Alert and cooperative. Normal mood and affect. General:   Alert,  Well-developed, well-nourished, pleasant and cooperative in NAD Head:  Normocephalic and atraumatic. Eyes:  Sclera clear, no icterus.   Conjunctiva pink.  Neurologic:  Alert and oriented x3;  grossly normal neurologically. Psych:  Alert and cooperative. Normal mood and affect.  Imaging Studies: No results found.  Assessment and Plan:   Phyllis Jones is a 32 y.o. y/o female has been referred for blood in stool.  Labs suggest iron deficiency anemia although the hemoglobin has improved.     Plan 1.  Celiac serology, H. pylori breath test, urinalysis 2.  Colonoscopy    I have discussed alternative options, risks & benefits,  which include, but are not limited to, bleeding, infection, perforation,respiratory complication & drug reaction.  The patient agrees with this plan & written consent will be obtained.     Follow up in 2-3 months if hemorrhoids are present for banding  Dr Wyline Mood MD,MRCP(U.K)

## 2023-09-02 ENCOUNTER — Encounter: Payer: Self-pay | Admitting: Obstetrics

## 2023-09-04 LAB — CELIAC DISEASE AB SCREEN W/RFX
Antigliadin Abs, IgA: 6 U (ref 0–19)
IgA/Immunoglobulin A, Serum: 277 mg/dL (ref 87–352)

## 2023-09-04 LAB — URINALYSIS
Bilirubin, UA: NEGATIVE
Glucose, UA: NEGATIVE
Ketones, UA: NEGATIVE
Nitrite, UA: NEGATIVE
Protein,UA: NEGATIVE
RBC, UA: NEGATIVE
Specific Gravity, UA: 1.013 (ref 1.005–1.030)
Urobilinogen, Ur: 0.2 mg/dL (ref 0.2–1.0)
pH, UA: 6.5 (ref 5.0–7.5)

## 2023-09-04 LAB — IRON,TIBC AND FERRITIN PANEL
Ferritin: 11 ng/mL — ABNORMAL LOW (ref 15–150)
Iron Saturation: 10 % — ABNORMAL LOW (ref 15–55)
Iron: 39 ug/dL (ref 27–159)
Total Iron Binding Capacity: 393 ug/dL (ref 250–450)
UIBC: 354 ug/dL (ref 131–425)

## 2023-09-04 LAB — B12 AND FOLATE PANEL
Folate: 15.9 ng/mL (ref >3.0)
Vitamin B-12: 561 pg/mL (ref 232–1245)

## 2023-09-04 LAB — H. PYLORI BREATH TEST

## 2023-09-13 ENCOUNTER — Telehealth: Payer: Self-pay

## 2023-09-13 NOTE — Telephone Encounter (Signed)
Patient called wanting to reschedule her procedure. She decide to go with 10/25/2023. I then called the endoscopy unit with Verlon Au to change the date.

## 2023-09-20 ENCOUNTER — Ambulatory Visit: Payer: BC Managed Care – PPO | Admitting: Certified Nurse Midwife

## 2023-09-20 VITALS — BP 151/95 | HR 79 | Wt 127.5 lb

## 2023-09-20 DIAGNOSIS — Z30432 Encounter for removal of intrauterine contraceptive device: Secondary | ICD-10-CM | POA: Diagnosis not present

## 2023-09-20 DIAGNOSIS — R030 Elevated blood-pressure reading, without diagnosis of hypertension: Secondary | ICD-10-CM

## 2023-09-20 DIAGNOSIS — Z1332 Encounter for screening for maternal depression: Secondary | ICD-10-CM

## 2023-09-21 ENCOUNTER — Encounter: Payer: Self-pay | Admitting: Gastroenterology

## 2023-09-21 ENCOUNTER — Encounter: Payer: Self-pay | Admitting: Certified Nurse Midwife

## 2023-09-21 NOTE — Progress Notes (Signed)
    GYNECOLOGY CLINIC PROCEDURE NOTE  Ms. Phyllis Jones is a 33 y.o. [id number] here for Mirena  IUD removal. No GYN concerns.  Last pap smear was on 08/25/23 and was normal. She reports increased mood changes, unsure if related to Mirena  or stress secondary to medical issues of 25m old daughter. Not currently breastfeeding. Tearful while discussing.     09/21/2023    9:46 AM  GAD 7 : Generalized Anxiety Score  Nervous, Anxious, on Edge 2  Control/stop worrying 1  Worry too much - different things 1  Trouble relaxing 1  Restless 0  Easily annoyed or irritable 0  Afraid - awful might happen 0  Total GAD 7 Score 5  Anxiety Difficulty Somewhat difficult       09/21/2023    9:45 AM 05/11/2023    8:35 AM 10/29/2019   10:31 AM  Depression screen PHQ 2/9  Decreased Interest 0 0 0  Down, Depressed, Hopeless 1 0 0  PHQ - 2 Score 1 0 0  Altered sleeping 0 0 0  Tired, decreased energy 1 0 0  Change in appetite 0 0 0  Feeling bad or failure about yourself  0 0 0  Trouble concentrating 0 0 0  Moving slowly or fidgety/restless 1 0 0  Suicidal thoughts 0 0 0  PHQ-9 Score 3 0 0  Difficult doing work/chores Somewhat difficult Not difficult at all    BP (!) 151/95   Pulse 79   Wt 127 lb 8 oz (57.8 kg)   BMI 22.59 kg/m  Physical Exam Vitals reviewed.  Constitutional:      Appearance: Normal appearance.  Cardiovascular:     Rate and Rhythm: Normal rate.  Pulmonary:     Effort: Pulmonary effort is normal.  Skin:    General: Skin is warm and dry.  Neurological:     General: No focal deficit present.     Mental Status: She is alert and oriented to person, place, and time.  Psychiatric:        Attention and Perception: Attention normal.        Mood and Affect: Affect is tearful.        Speech: Speech normal.        Behavior: Behavior normal.        Thought Content: Thought content normal.      IUD Removal  Patient was in the dorsal lithotomy position, normal external genitalia was  noted.  A speculum was placed in the patient's vagina, normal discharge was noted, no lesions. The multiparous cervix was visualized, no lesions, no abnormal discharge.  The strings of the IUD were grasped and pulled using ring forceps. The IUD was removed in its entirety. Patient tolerated the procedure well.    1. Encounter for IUD removal (Primary)  2. Encounter for screening for maternal depression  3. Elevated blood pressure reading in office without diagnosis of hypertension    Reviewed PHQ & GAD scores today, both negative for diagnosis of depression or anxiety. Encouraged counseling for increased support with stress of daughter's medical needs.   Recommend monitoring BP at home and follow up with PCP.  Patient will use condoms for contraception.  Routine preventative health maintenance measures emphasized.  Jayne Harlene CROME, CNM 09/21/2023 9:46 AM

## 2023-09-27 DIAGNOSIS — F4323 Adjustment disorder with mixed anxiety and depressed mood: Secondary | ICD-10-CM | POA: Diagnosis not present

## 2023-09-29 DIAGNOSIS — F411 Generalized anxiety disorder: Secondary | ICD-10-CM | POA: Diagnosis not present

## 2023-10-14 DIAGNOSIS — F411 Generalized anxiety disorder: Secondary | ICD-10-CM | POA: Diagnosis not present

## 2023-10-25 ENCOUNTER — Ambulatory Visit: Payer: BC Managed Care – PPO | Admitting: Certified Registered Nurse Anesthetist

## 2023-10-25 ENCOUNTER — Encounter
Admission: RE | Disposition: A | Discharge status: Home / Self Care | Payer: Self-pay | Source: Home / Self Care | Attending: Gastroenterology

## 2023-10-25 ENCOUNTER — Ambulatory Visit
Admission: RE | Admit: 2023-10-25 | Discharge: 2023-10-25 | Disposition: A | Discharge status: Home / Self Care | Payer: BC Managed Care – PPO | Attending: Gastroenterology | Admitting: Gastroenterology

## 2023-10-25 ENCOUNTER — Encounter: Payer: Self-pay | Admitting: Gastroenterology

## 2023-10-25 DIAGNOSIS — D508 Other iron deficiency anemias: Secondary | ICD-10-CM

## 2023-10-25 DIAGNOSIS — D509 Iron deficiency anemia, unspecified: Secondary | ICD-10-CM | POA: Diagnosis not present

## 2023-10-25 DIAGNOSIS — Z79899 Other long term (current) drug therapy: Secondary | ICD-10-CM | POA: Insufficient documentation

## 2023-10-25 DIAGNOSIS — K640 First degree hemorrhoids: Secondary | ICD-10-CM | POA: Diagnosis not present

## 2023-10-25 DIAGNOSIS — K921 Melena: Secondary | ICD-10-CM | POA: Insufficient documentation

## 2023-10-25 HISTORY — PX: COLONOSCOPY WITH PROPOFOL: SHX5780

## 2023-10-25 LAB — POCT PREGNANCY, URINE: Preg Test, Ur: NEGATIVE

## 2023-10-25 SURGERY — COLONOSCOPY WITH PROPOFOL
Anesthesia: General

## 2023-10-25 MED ORDER — SODIUM CHLORIDE 0.9 % IV SOLN: INTRAVENOUS | Status: DC

## 2023-10-25 MED ORDER — MIDAZOLAM HCL 2 MG/2ML IJ SOLN
INTRAMUSCULAR | Status: DC | PRN
  Administered 2023-10-25: 2 mg via INTRAVENOUS

## 2023-10-25 MED ORDER — DEXMEDETOMIDINE HCL IN NACL 80 MCG/20ML IV SOLN
INTRAVENOUS | Status: DC | PRN
Start: 2023-10-25 — End: 2023-10-25
  Administered 2023-10-25: 8 ug via INTRAVENOUS
  Administered 2023-10-25: 12 ug via INTRAVENOUS

## 2023-10-25 MED ORDER — PROPOFOL 500 MG/50ML IV EMUL
INTRAVENOUS | Status: DC | PRN
  Administered 2023-10-25: 160 ug/kg/min via INTRAVENOUS

## 2023-10-25 MED ORDER — MIDAZOLAM HCL 2 MG/2ML IJ SOLN
INTRAMUSCULAR | Status: AC
  Filled 2023-10-25: qty 2

## 2023-10-25 MED ORDER — PROPOFOL 10 MG/ML IV BOLUS
INTRAVENOUS | Status: DC | PRN
  Administered 2023-10-25: 60 mg via INTRAVENOUS
  Administered 2023-10-25 (×2): 20 mg via INTRAVENOUS

## 2023-10-25 NOTE — Op Note (Addendum)
[facility] Gastroenterology Patient Name: Phyllis Jones Procedure Date: 10/25/2023 8:02 AM MRN: [id number] Account #: [id number] Date of Birth: Dec 23, 1990 Admit Type: Outpatient Age: 33 Room: [facility] ENDO ROOM 2 Gender: Female Note Status: Finalized Instrument Name: Peds Colonoscope [id number] Procedure:             Colonoscopy Indications:           Heme positive stool Providers:             Wyline Mood MD, MD Medicines:             Monitored Anesthesia Care Complications:         No immediate complications. Procedure:             Pre-Anesthesia Assessment:                        - Prior to the procedure, a History and Physical was                         performed, and patient medications, allergies and                         sensitivities were reviewed. The patient's tolerance                         of previous anesthesia was reviewed.                        - The risks and benefits of the procedure and the                         sedation options and risks were discussed with the                         patient. All questions were answered and informed                         consent was obtained.                        - After reviewing the risks and benefits, the patient                         was deemed in satisfactory condition to undergo the                         procedure.                        - ASA Grade Assessment: II - A patient with mild                         systemic disease.                        After obtaining informed consent, the colonoscope was                         passed under direct vision. Throughout the procedure,  the patient's blood pressure, pulse, and oxygen                         saturations were monitored continuously. The                         Colonoscope was introduced through the anus and                         advanced to the the cecum, identified by the                         appendiceal  orifice. The colonoscopy was performed                         with ease. The patient tolerated the procedure well.                         The quality of the bowel preparation was excellent.                         Anatomical landmarks were photographed. Findings:      The perianal and digital rectal examinations were normal.      Non-bleeding internal hemorrhoids were found during retroflexion. The       hemorrhoids were medium-sized and Grade I (internal hemorrhoids that do       not prolapse).      The exam was otherwise without abnormality on direct and retroflexion       views. Impression:            - Non-bleeding internal hemorrhoids.                        - The examination was otherwise normal on direct and                         retroflexion views.                        - No specimens collected. Recommendation:        - Discharge patient to home (with escort).                        - Advance diet as tolerated.                        - Continue present medications.                        - Return to my office as previously scheduled. Procedure Code(s):     --- Professional ---                        [phone], Colonoscopy, flexible; diagnostic, including                         collection of specimen(s) by brushing or washing, when                         performed (separate procedure) Diagnosis Code(s):     ---  Professional ---                        K64.0, First degree hemorrhoids                        R19.5, Other fecal abnormalities CPT copyright 2022 American Medical Association. All rights reserved. The codes documented in this report are preliminary and upon coder review may  be revised to meet current compliance requirements. Wyline Mood, MD Wyline Mood MD, MD 10/25/2023 8:23:45 AM This report has been signed electronically. Number of Addenda: 0 Note Initiated On: 10/25/2023 8:02 AM Scope Withdrawal Time: 0 hours 6 minutes 19 seconds  Total Procedure Duration: 0  hours 8 minutes 32 seconds  Estimated Blood Loss:  Estimated blood loss: none.      [facility]

## 2023-10-25 NOTE — H&P (Signed)
Wyline Mood, MD [address], [place], [place], [address] [address], [address], [place], [place], [address] Phone: [phone]  Fax: [phone]  Primary Care Physician:  Margarita Mail, DO   Pre-Procedure History & Physical: HPI:  Phyllis Jones is a 33 y.o. female is here for an colonoscopy.   Past Medical History:  Diagnosis Date   Anemia     Past Surgical History:  Procedure Laterality Date   TONSILLECTOMY  2004    Prior to Admission medications   Medication Sig Start Date End Date Taking? Authorizing Provider  ferrous sulfate 325 (65 FE) MG tablet Take 1 tablet (325 mg total) by mouth 2 (two) times daily with a meal. 10/12/22  Yes Linzie Collin, MD    Allergies as of 09/02/2023   (No Known Allergies)    Family History  Problem Relation Age of Onset   Cancer Maternal Grandfather 36       lung   Cancer Paternal Grandmother 17       uterine   Asthma Paternal Grandfather    Breast cancer Neg Hx    Ovarian cancer Neg Hx    Colon cancer Neg Hx     Social History   Socioeconomic History   Marital status: Married    Spouse name: Phyllis Jones   Number of children: 2   Years of education: 20   Highest education level: Not on file  Occupational History   Occupation: physical therapist  Tobacco Use   Smoking status: Never   Smokeless tobacco: Never  Vaping Use   Vaping status: Never Used  Substance and Sexual Activity   Alcohol use: Not Currently    Comment: rarely   Drug use: Never   Sexual activity: Yes    Partners: Male    Birth control/protection: I.U.D.    Comment: undecided  Other Topics Concern   Not on file  Social History Narrative   Not on file   Social Drivers of Health   Financial Resource Strain: Low Risk  (05/11/2023)   Overall Financial Resource Strain (CARDIA)    Difficulty of Paying Living Expenses: Not hard at all  Food Insecurity: No Food Insecurity (01/08/2023)   Hunger Vital Sign    Worried About Running  Out of Food in the Last Year: Never true    Ran Out of Food in the Last Year: Never true  Transportation Needs: No Transportation Needs (01/08/2023)   PRAPARE - Administrator, Civil Service (Medical): No    Lack of Transportation (Non-Medical): No  Physical Activity: Insufficiently Active (05/11/2023)   Exercise Vital Sign    Days of Exercise per Week: 2 days    Minutes of Exercise per Session: 30 min  Stress: No Stress Concern Present (05/11/2023)   [employer] of Occupational Health - Occupational Stress Questionnaire    Feeling of Stress : Only a little  Social Connections: Moderately Integrated (05/11/2023)   Social Connection and Isolation Panel [NHANES]    Frequency of Communication with Friends and Family: More than three times a week    Frequency of Social Gatherings with Friends and Family: More than three times a week    Attends Religious Services: More than 4 times per year    Active Member of [employer] or Organizations: No    Attends Banker Meetings: Never    Marital Status: Married  Catering manager Violence: Not At Risk (01/08/2023)   Humiliation, Afraid, Rape, and Kick questionnaire  Fear of Current or Ex-Partner: No    Emotionally Abused: No    Physically Abused: No    Sexually Abused: No    Review of Systems: See HPI, otherwise negative ROS  Physical Exam: BP 130/78   Pulse 100   Temp (!) 97.2 F (36.2 C) (Temporal)   Resp 16   Ht 5\' 3"  (1.6 m)   Wt 53.2 kg   SpO2 100%   BMI 20.76 kg/m  General:   Alert,  pleasant and cooperative in NAD Head:  Normocephalic and atraumatic. Neck:  Supple; no masses or thyromegaly. Lungs:  Clear throughout to auscultation, normal respiratory effort.    Heart:  +S1, +S2, Regular rate and rhythm, No edema. Abdomen:  Soft, nontender and nondistended. Normal bowel sounds, without guarding, and without rebound.   Neurologic:  Alert and  oriented x4;  grossly normal  neurologically.  Impression/Plan: Phyllis Jones is here for an colonoscopy to be performed for blood in stool  Risks, benefits, limitations, and alternatives regarding  colonoscopy have been reviewed with the patient.  Questions have been answered.  All parties agreeable.   Wyline Mood, MD  10/25/2023, 7:40 AM

## 2023-10-25 NOTE — Anesthesia Procedure Notes (Signed)
Date/Time: 10/25/2023 8:09 AM  Performed by: Malva Cogan, CRNAPre-anesthesia Checklist: Patient identified, Emergency Drugs available, Suction available, Patient being monitored and Timeout performed Patient Re-evaluated:Patient Re-evaluated prior to induction Oxygen Delivery Method: Nasal cannula Induction Type: IV induction Placement Confirmation: CO2 detector and positive ETCO2

## 2023-10-25 NOTE — Anesthesia Postprocedure Evaluation (Signed)
Anesthesia Post Note  Patient: Oneida Mckamey  Procedure(s) Performed: COLONOSCOPY WITH PROPOFOL  Patient location during evaluation: Endoscopy Anesthesia Type: General Level of consciousness: awake and alert Pain management: pain level controlled Vital Signs Assessment: post-procedure vital signs reviewed and stable Respiratory status: spontaneous breathing, nonlabored ventilation, respiratory function stable and patient connected to nasal cannula oxygen Cardiovascular status: blood pressure returned to baseline and stable Postop Assessment: no apparent nausea or vomiting Anesthetic complications: no   No notable events documented.   Last Vitals:  Vitals:   10/25/23 0833 10/25/23 0843  BP: 102/64 104/68  Pulse: 82 75  Resp: 16 16  Temp:    SpO2: 99% 100%    Last Pain:  Vitals:   10/25/23 0833  TempSrc:   PainSc: 0-No pain                 Cleda Mccreedy Stephen Baruch

## 2023-10-25 NOTE — Transfer of Care (Signed)
Immediate Anesthesia Transfer of Care Note  Patient: Phyllis Jones  Procedure(s) Performed: COLONOSCOPY WITH PROPOFOL  Patient Location: PACU  Anesthesia Type:General  Level of Consciousness: drowsy  Airway & Oxygen Therapy: Patient Spontanous Breathing  Post-op Assessment: Report given to RN and Post -op Vital signs reviewed and stable  Post vital signs: Reviewed and stable  Last Vitals:  Vitals Value Taken Time  BP    Temp    Pulse 72 10/25/23 0823  Resp 16 10/25/23 0823  SpO2 97 % 10/25/23 0823  Vitals shown include unfiled device data.  Last Pain:  Vitals:   10/25/23 0729  TempSrc: Temporal  PainSc: 0-No pain         Complications: No notable events documented.

## 2023-10-25 NOTE — Anesthesia Preprocedure Evaluation (Signed)
Anesthesia Evaluation  Patient identified by MRN, date of birth, ID band Patient awake    Reviewed: Allergy & Precautions, NPO status , Patient's Chart, lab work & pertinent test results  History of Anesthesia Complications Negative for: history of anesthetic complications  Airway Mallampati: II  TM Distance: <3 FB Neck ROM: full    Dental  (+) Chipped   Pulmonary neg pulmonary ROS, neg shortness of breath   Pulmonary exam normal        Cardiovascular negative cardio ROS Normal cardiovascular exam     Neuro/Psych negative neurological ROS  negative psych ROS   GI/Hepatic negative GI ROS, Neg liver ROS,neg GERD  ,,  Endo/Other  negative endocrine ROS    Renal/GU negative Renal ROS  negative genitourinary   Musculoskeletal   Abdominal   Peds  Hematology negative hematology ROS (+)   Anesthesia Other Findings Past Medical History: No date: Anemia  Past Surgical History: 2004: TONSILLECTOMY     Reproductive/Obstetrics negative OB ROS                             Anesthesia Physical Anesthesia Plan  ASA: 2  Anesthesia Plan: General   Post-op Pain Management:    Induction: Intravenous  PONV Risk Score and Plan: Propofol infusion and TIVA  Airway Management Planned: Natural Airway and Nasal Cannula  Additional Equipment:   Intra-op Plan:   Post-operative Plan:   Informed Consent: I have reviewed the patients History and Physical, chart, labs and discussed the procedure including the risks, benefits and alternatives for the proposed anesthesia with the patient or authorized representative who has indicated his/her understanding and acceptance.     Dental Advisory Given  Plan Discussed with: Anesthesiologist, CRNA and Surgeon  Anesthesia Plan Comments: (Patient consented for risks of anesthesia including but not limited to:  - adverse reactions to medications - risk of  airway placement if required - damage to eyes, teeth, lips or other oral mucosa - nerve damage due to positioning  - sore throat or hoarseness - Damage to heart, brain, nerves, lungs, other parts of body or loss of life  Patient voiced understanding and assent.)       Anesthesia Quick Evaluation

## 2023-10-26 ENCOUNTER — Encounter: Payer: Self-pay | Admitting: Gastroenterology

## 2023-11-01 DIAGNOSIS — F411 Generalized anxiety disorder: Secondary | ICD-10-CM | POA: Diagnosis not present

## 2023-12-06 ENCOUNTER — Ambulatory Visit: Payer: BC Managed Care – PPO | Admitting: Gastroenterology

## 2023-12-29 DIAGNOSIS — E612 Magnesium deficiency: Secondary | ICD-10-CM | POA: Diagnosis not present

## 2023-12-29 DIAGNOSIS — R5383 Other fatigue: Secondary | ICD-10-CM | POA: Diagnosis not present

## 2023-12-29 DIAGNOSIS — E611 Iron deficiency: Secondary | ICD-10-CM | POA: Diagnosis not present

## 2023-12-29 DIAGNOSIS — Z1329 Encounter for screening for other suspected endocrine disorder: Secondary | ICD-10-CM | POA: Diagnosis not present

## 2023-12-29 DIAGNOSIS — E559 Vitamin D deficiency, unspecified: Secondary | ICD-10-CM | POA: Diagnosis not present

## 2023-12-29 DIAGNOSIS — Z131 Encounter for screening for diabetes mellitus: Secondary | ICD-10-CM | POA: Diagnosis not present

## 2023-12-29 DIAGNOSIS — Z1321 Encounter for screening for nutritional disorder: Secondary | ICD-10-CM | POA: Diagnosis not present

## 2024-03-09 DIAGNOSIS — F419 Anxiety disorder, unspecified: Secondary | ICD-10-CM | POA: Diagnosis not present

## 2024-03-09 DIAGNOSIS — R5383 Other fatigue: Secondary | ICD-10-CM | POA: Diagnosis not present

## 2024-03-09 DIAGNOSIS — E559 Vitamin D deficiency, unspecified: Secondary | ICD-10-CM | POA: Diagnosis not present

## 2024-03-09 DIAGNOSIS — E611 Iron deficiency: Secondary | ICD-10-CM | POA: Diagnosis not present

## 2024-05-07 DIAGNOSIS — L578 Other skin changes due to chronic exposure to nonionizing radiation: Secondary | ICD-10-CM | POA: Diagnosis not present

## 2024-05-07 DIAGNOSIS — Z86018 Personal history of other benign neoplasm: Secondary | ICD-10-CM | POA: Diagnosis not present

## 2024-05-07 DIAGNOSIS — L2089 Other atopic dermatitis: Secondary | ICD-10-CM | POA: Diagnosis not present

## 2024-05-07 DIAGNOSIS — L720 Epidermal cyst: Secondary | ICD-10-CM | POA: Diagnosis not present

## 2024-05-16 ENCOUNTER — Ambulatory Visit: Payer: Self-pay | Admitting: Internal Medicine

## 2024-05-21 ENCOUNTER — Ambulatory Visit (INDEPENDENT_AMBULATORY_CARE_PROVIDER_SITE_OTHER): Admitting: Internal Medicine

## 2024-05-21 ENCOUNTER — Encounter: Payer: Self-pay | Admitting: Internal Medicine

## 2024-05-21 ENCOUNTER — Other Ambulatory Visit: Payer: Self-pay

## 2024-05-21 VITALS — BP 110/72 | HR 95 | Temp 97.7°F | Resp 16 | Ht 60.0 in | Wt 121.5 lb

## 2024-05-21 DIAGNOSIS — Z0000 Encounter for general adult medical examination without abnormal findings: Secondary | ICD-10-CM | POA: Diagnosis not present

## 2024-05-21 DIAGNOSIS — D509 Iron deficiency anemia, unspecified: Secondary | ICD-10-CM | POA: Diagnosis not present

## 2024-05-21 DIAGNOSIS — Z13220 Encounter for screening for lipoid disorders: Secondary | ICD-10-CM

## 2024-05-21 LAB — COMPREHENSIVE METABOLIC PANEL WITH GFR
AG Ratio: 2 (calc) (ref 1.0–2.5)
ALT: 14 U/L (ref 6–29)
AST: 18 U/L (ref 10–30)
Albumin: 4.9 g/dL (ref 3.6–5.1)
Alkaline phosphatase (APISO): 43 U/L (ref 31–125)
BUN: 14 mg/dL (ref 7–25)
CO2: 26 mmol/L (ref 20–32)
Calcium: 9.2 mg/dL (ref 8.6–10.2)
Chloride: 104 mmol/L (ref 98–110)
Creat: 0.77 mg/dL (ref 0.50–0.97)
Globulin: 2.4 g/dL (ref 1.9–3.7)
Glucose, Bld: 87 mg/dL (ref 65–99)
Potassium: 4.3 mmol/L (ref 3.5–5.3)
Sodium: 137 mmol/L (ref 135–146)
Total Bilirubin: 0.5 mg/dL (ref 0.2–1.2)
Total Protein: 7.3 g/dL (ref 6.1–8.1)
eGFR: 106 mL/min/1.73m2 (ref >=60)

## 2024-05-21 LAB — LIPID PANEL
Cholesterol: 186 mg/dL (ref <200)
HDL: 47 mg/dL — ABNORMAL LOW (ref >=50)
LDL Cholesterol (Calc): 121 mg/dL — ABNORMAL HIGH
Non-HDL Cholesterol (Calc): 139 mg/dL — ABNORMAL HIGH (ref <130)
Total CHOL/HDL Ratio: 4 (calc) (ref <5.0)
Triglycerides: 81 mg/dL (ref <150)

## 2024-05-21 LAB — CBC WITH DIFFERENTIAL/PLATELET
Absolute Lymphocytes: 1982 {cells}/uL (ref 850–3900)
Absolute Monocytes: 541 {cells}/uL (ref 200–950)
Basophils Absolute: 69 {cells}/uL (ref 0–200)
Basophils Relative: 1.3 %
Eosinophils Absolute: 292 {cells}/uL (ref 15–500)
Eosinophils Relative: 5.5 %
HCT: 43 % (ref 35.0–45.0)
Hemoglobin: 14.1 g/dL (ref 11.7–15.5)
MCH: 28.1 pg (ref 27.0–33.0)
MCHC: 32.8 g/dL (ref 32.0–36.0)
MCV: 85.7 fL (ref 80.0–100.0)
MPV: 10.3 fL (ref 7.5–12.5)
Monocytes Relative: 10.2 %
Neutro Abs: 2417 {cells}/uL (ref 1500–7800)
Neutrophils Relative %: 45.6 %
Platelets: 344 Thousand/uL (ref 140–400)
RBC: 5.02 Million/uL (ref 3.80–5.10)
RDW: 13.1 % (ref 11.0–15.0)
Total Lymphocyte: 37.4 %
WBC: 5.3 Thousand/uL (ref 3.8–10.8)

## 2024-05-21 LAB — IRON,TIBC AND FERRITIN PANEL
%SAT: 11 % — ABNORMAL LOW (ref 16–45)
Ferritin: 12 ng/mL — ABNORMAL LOW (ref 16–154)
Iron: 41 ug/dL (ref 40–190)
TIBC: 381 ug/dL (ref 250–450)

## 2024-05-21 NOTE — Progress Notes (Signed)
 Established Patient Office Visit  Subjective    Patient ID: Phyllis Jones, female    DOB: 12-19-1990  Age: 33 y.o. MRN: [id number]  CC:  Chief Complaint  Patient presents with   Medical Management of Chronic Issues    HPI Phyllis Jones presents to follow up on chronic medical conditions.   Discussed the use of AI scribe software for clinical note transcription with the patient, who gave verbal consent to proceed.  History of Present Illness Phyllis Jones is a 33 year old female with a history of anemia who presents for follow-up on her iron  levels.  Her ferritin levels have improved from 4 two years ago to 11 last year, though she remains below the normal range. Hemoglobin levels are currently at 13, which is within the normal range. She experiences increased energy when taking iron  supplements consistently but finds daily adherence challenging.  She monitors her diet and has increased physical activity by joining a gym, attending three to four times a week. She was postpartum last year and has since been more active, aiming to manage her slightly elevated cholesterol levels from last year.  Iron  Deficiency Anemia: -Last labs with improvement, hgb 13.0, ferritin 11 last year -She does have a history of requiring blood transfusions in the past while pregnant -Currently on iron  supplement but not daily -Had a Mirena  IUD placed in June 2024 but had it removed in December (due to hormonal symptoms) but no issues with periods  Health Maintenance: -Blood work due -Pap at Gynecology 12/24  Outpatient Encounter Medications as of 05/21/2024  Medication Sig   ferrous sulfate  325 (65 FE) MG tablet Take 1 tablet (325 mg total) by mouth 2 (two) times daily with a meal.   No facility-administered encounter medications on file as of 05/21/2024.    Past Medical History:  Diagnosis Date   Anemia     Past Surgical History:  Procedure Laterality Date   COLONOSCOPY WITH PROPOFOL  N/A 10/25/2023    Procedure: COLONOSCOPY WITH PROPOFOL ;  Surgeon: Therisa Bi, MD;  Location: [facility] ENDOSCOPY;  Service: Gastroenterology;  Laterality: N/A;   TONSILLECTOMY  2004    Family History  Problem Relation Age of Onset   Cancer Maternal Grandfather 65       lung   Cancer Paternal Grandmother 59       uterine   Asthma Paternal Grandfather    Breast cancer Neg Hx    Ovarian cancer Neg Hx    Colon cancer Neg Hx     Social History   Socioeconomic History   Marital status: Married    Spouse name: Layman   Number of children: 2   Years of education: 20   Highest education level: Doctorate  Occupational History   Occupation: physical therapist  Tobacco Use   Smoking status: Never   Smokeless tobacco: Never  Vaping Use   Vaping status: Never Used  Substance and Sexual Activity   Alcohol use: Not Currently    Comment: rarely   Drug use: Never   Sexual activity: Yes    Partners: Male    Birth control/protection: I.U.D.    Comment: undecided  Other Topics Concern   Not on file  Social History Narrative   Not on file   Social Drivers of Health   Financial Resource Strain: Low Risk  (05/20/2024)   Overall Financial Resource Strain (CARDIA)    Difficulty of Paying Living Expenses: Not hard at all  Food Insecurity: No Food Insecurity (05/20/2024)  Hunger Vital Sign    Worried About Running Out of Food in the Last Year: Never true    Ran Out of Food in the Last Year: Never true  Transportation Needs: No Transportation Needs (05/20/2024)   PRAPARE - Administrator, Civil Service (Medical): No    Lack of Transportation (Non-Medical): No  Physical Activity: Sufficiently Active (05/20/2024)   Exercise Vital Sign    Days of Exercise per Week: 3 days    Minutes of Exercise per Session: 50 min  Stress: No Stress Concern Present (05/20/2024)   [employer] of Occupational Health - Occupational Stress Questionnaire    Feeling of Stress: Not at all  Social Connections:  Socially Integrated (05/20/2024)   Social Connection and Isolation Panel    Frequency of Communication with Friends and Family: More than three times a week    Frequency of Social Gatherings with Friends and Family: More than three times a week    Attends Religious Services: More than 4 times per year    Active Member of [employer] or Organizations: Yes    Attends Engineer, structural: More than 4 times per year    Marital Status: Married  Catering manager Violence: Not At Risk (01/08/2023)   Humiliation, Afraid, Rape, and Kick questionnaire    Fear of Current or Ex-Partner: No    Emotionally Abused: No    Physically Abused: No    Sexually Abused: No    Review of Systems  Constitutional:  Negative for chills, fever and malaise/fatigue.  All other systems reviewed and are negative.       Objective    BP 110/72 (Cuff Size: Normal)   Pulse 95   Temp 97.7 F (36.5 C) (Oral)   Resp 16   Ht 5' (1.524 m)   Wt 121 lb 8 oz (55.1 kg)   SpO2 98%   BMI 23.73 kg/m   Physical Exam Constitutional:      Appearance: Normal appearance.  HENT:     Head: Normocephalic and atraumatic.     Mouth/Throat:     Mouth: Mucous membranes are moist.     Pharynx: Oropharynx is clear.  Eyes:     Extraocular Movements: Extraocular movements intact.     Conjunctiva/sclera: Conjunctivae normal.     Pupils: Pupils are equal, round, and reactive to light.  Neck:     Comments: No thyromegaly  Cardiovascular:     Rate and Rhythm: Normal rate and regular rhythm.  Pulmonary:     Effort: Pulmonary effort is normal.     Breath sounds: Normal breath sounds.  Musculoskeletal:     Cervical back: No tenderness.     Right lower leg: No edema.     Left lower leg: No edema.  Lymphadenopathy:     Cervical: No cervical adenopathy.  Skin:    General: Skin is warm and dry.  Neurological:     General: No focal deficit present.     Mental Status: She is alert. Mental status is at baseline.  Psychiatric:         Mood and Affect: Mood normal.        Behavior: Behavior normal.         Assessment & Plan:   Assessment & Plan Iron  deficiency anemia Iron  deficiency anemia with improved hemoglobin but low ferritin. Discussed maintaining iron  stores due to menstrual blood loss. Explained oral iron  side effects and alternate-day dosing efficacy. Emphasized absorption importance to avoid transfusions. - Order repeat  labs for ferritin and hemoglobin. - Continue oral iron , consider alternate-day dosing if stable. - Monitor anemia symptoms and adjust iron  as needed.  Hyperlipidemia Hyperlipidemia with slightly elevated cholesterol. Acknowledged lifestyle changes and emphasized monitoring cholesterol trends. - Order lipid panel. - Continue diet and exercise modifications.  - CBC w/Diff/Platelet - Comprehensive Metabolic Panel (CMET) - Lipid Profile - Fe+TIBC+Fer  Return in about 1 year (around 05/21/2025).   Sharyle Fischer, DO

## 2024-05-22 ENCOUNTER — Ambulatory Visit: Payer: Self-pay | Admitting: Internal Medicine

## 2024-05-22 DIAGNOSIS — R5383 Other fatigue: Secondary | ICD-10-CM | POA: Diagnosis not present

## 2024-05-22 DIAGNOSIS — Z1329 Encounter for screening for other suspected endocrine disorder: Secondary | ICD-10-CM | POA: Diagnosis not present

## 2024-05-22 DIAGNOSIS — E559 Vitamin D deficiency, unspecified: Secondary | ICD-10-CM | POA: Diagnosis not present

## 2024-05-22 DIAGNOSIS — E611 Iron deficiency: Secondary | ICD-10-CM | POA: Diagnosis not present

## 2024-08-01 ENCOUNTER — Encounter: Payer: Self-pay | Admitting: Internal Medicine

## 2024-08-15 DIAGNOSIS — E559 Vitamin D deficiency, unspecified: Secondary | ICD-10-CM | POA: Diagnosis not present

## 2024-08-15 DIAGNOSIS — Z131 Encounter for screening for diabetes mellitus: Secondary | ICD-10-CM | POA: Diagnosis not present

## 2024-08-15 DIAGNOSIS — R5383 Other fatigue: Secondary | ICD-10-CM | POA: Diagnosis not present

## 2024-08-15 DIAGNOSIS — E611 Iron deficiency: Secondary | ICD-10-CM | POA: Diagnosis not present

## 2024-08-15 DIAGNOSIS — Z1329 Encounter for screening for other suspected endocrine disorder: Secondary | ICD-10-CM | POA: Diagnosis not present

## 2024-08-31 ENCOUNTER — Ambulatory Visit: Admitting: Internal Medicine

## 2024-09-10 ENCOUNTER — Encounter: Payer: Self-pay | Admitting: Internal Medicine

## 2024-09-10 ENCOUNTER — Ambulatory Visit (INDEPENDENT_AMBULATORY_CARE_PROVIDER_SITE_OTHER): Admitting: Internal Medicine

## 2024-09-10 ENCOUNTER — Other Ambulatory Visit: Payer: Self-pay

## 2024-09-10 VITALS — BP 116/72 | HR 72 | Temp 98.2°F | Resp 16 | Ht 62.5 in | Wt 122.5 lb

## 2024-09-10 DIAGNOSIS — R790 Abnormal level of blood mineral: Secondary | ICD-10-CM | POA: Diagnosis not present

## 2024-09-10 DIAGNOSIS — Z111 Encounter for screening for respiratory tuberculosis: Secondary | ICD-10-CM | POA: Diagnosis not present

## 2024-09-10 DIAGNOSIS — Z0289 Encounter for other administrative examinations: Secondary | ICD-10-CM | POA: Diagnosis not present

## 2024-09-10 NOTE — Progress Notes (Signed)
" ° °  Acute Office Visit  Subjective:     Patient ID: Phyllis Jones, female    DOB: September 04, 1991, 33 y.o.   MRN: [id number]  Chief Complaint  Patient presents with   Consult    Forms for [facility]    HPI Patient is in today for forms for foster care.   Discussed the use of AI scribe software for clinical note transcription with the patient, who gave verbal consent to proceed.  History of Present Illness Phyllis Jones is a 33 year old female who presents for TB screening and follow-up on iron  levels.  She is completing a Quantiferon Gold test for tuberculosis screening required for foster care licensing paperwork.  Recent labs showed normal hemoglobin with low ferritin. She is taking iron  supplements every other day and is here to review these results and management.  She and her husband are in the process of foster care licensing and she remains motivated to proceed with fostering.   Review of Systems  All other systems reviewed and are negative.       Objective:    BP 116/72 (Cuff Size: Normal)   Pulse 72   Temp 98.2 F (36.8 C) (Oral)   Resp 16   Ht 5' 2.5 (1.588 m)   Wt 122 lb 8 oz (55.6 kg)   SpO2 99%   BMI 22.05 kg/m    Physical Exam Constitutional:      Appearance: Normal appearance.  HENT:     Head: Normocephalic and atraumatic.  Eyes:     Conjunctiva/sclera: Conjunctivae normal.  Cardiovascular:     Rate and Rhythm: Normal rate and regular rhythm.  Pulmonary:     Effort: Pulmonary effort is normal.     Breath sounds: Normal breath sounds.  Skin:    General: Skin is warm and dry.  Neurological:     General: No focal deficit present.     Mental Status: She is alert. Mental status is at baseline.  Psychiatric:        Mood and Affect: Mood normal.        Behavior: Behavior normal.     No results found for any visits on 09/10/24.      Assessment & Plan:   Assessment & Plan Encounter for Form Completion/Screening for respiratory  tuberculosis Quantiferon Gold test preferred. - Ordered Quantiferon Gold test. - Will provide results via MyChart. - Will wait for results and fill out forms for medical evaluation for fostering form.   Iron  deficiency without anemia Low ferritin indicates iron  deficiency. Taking iron  supplements to replenish stores. - Continue iron  supplementation every other day.  - QuantiFERON-TB Gold Plus  Return for already scheduled.  Sharyle Fischer, DO   "

## 2024-09-12 ENCOUNTER — Ambulatory Visit: Payer: Self-pay | Admitting: Internal Medicine

## 2024-09-12 LAB — QUANTIFERON-TB GOLD PLUS
Mitogen-NIL: >10 [IU]/mL
NIL: 0.03 [IU]/mL
QuantiFERON-TB Gold Plus: NEGATIVE
TB1-NIL: 0 [IU]/mL
TB2-NIL: 0.02 [IU]/mL

## 2025-05-27 ENCOUNTER — Ambulatory Visit: Admitting: Internal Medicine
